# Patient Record
Sex: Male | Born: 1950
Health system: Southern US, Community
[De-identification: ages and names within clinical notes are randomized; demographics above are authoritative.]

## PROBLEM LIST (undated history)

## (undated) DIAGNOSIS — H269 Unspecified cataract: Secondary | ICD-10-CM

## (undated) DIAGNOSIS — M1990 Unspecified osteoarthritis, unspecified site: Secondary | ICD-10-CM

## (undated) DIAGNOSIS — G2581 Restless legs syndrome: Secondary | ICD-10-CM

## (undated) HISTORY — PX: CHOLECYSTECTOMY: SHX55

## (undated) HISTORY — PX: OTHER SURGICAL HISTORY: SHX169

## (undated) HISTORY — DX: Unspecified cataract: H26.9

---

## 2008-02-09 ENCOUNTER — Ambulatory Visit (HOSPITAL_BASED_OUTPATIENT_CLINIC_OR_DEPARTMENT_OTHER): Admission: RE | Admit: 2008-02-09 | Discharge: 2008-02-09 | Payer: Self-pay | Admitting: Orthopedic Surgery

## 2008-03-26 ENCOUNTER — Ambulatory Visit (HOSPITAL_COMMUNITY): Admission: EM | Admit: 2008-03-26 | Discharge: 2008-03-27 | Payer: Self-pay | Admitting: Emergency Medicine

## 2008-03-26 ENCOUNTER — Encounter (INDEPENDENT_AMBULATORY_CARE_PROVIDER_SITE_OTHER): Payer: Self-pay | Admitting: General Surgery

## 2008-12-25 ENCOUNTER — Ambulatory Visit: Payer: Self-pay | Admitting: Gastroenterology

## 2009-01-03 ENCOUNTER — Ambulatory Visit: Payer: Self-pay | Admitting: Gastroenterology

## 2010-08-10 IMAGING — RF DG CHOLANGIOGRAM OPERATIVE
1 series · 4 of 4 positions shown · non-contrast
Comparison: None

CLINICAL DATA: Cholecystitis

INTRAOPERATIVE CHOLANGIOGRAM
TECHNIQUE: Multiple fluoroscopic spot radiographs were obtained
during intraoperative cholangiogram and are submitted for
interpretation post-operatively.

[Series 1: run · 4 of 96 frames shown]
[frame 15/96]
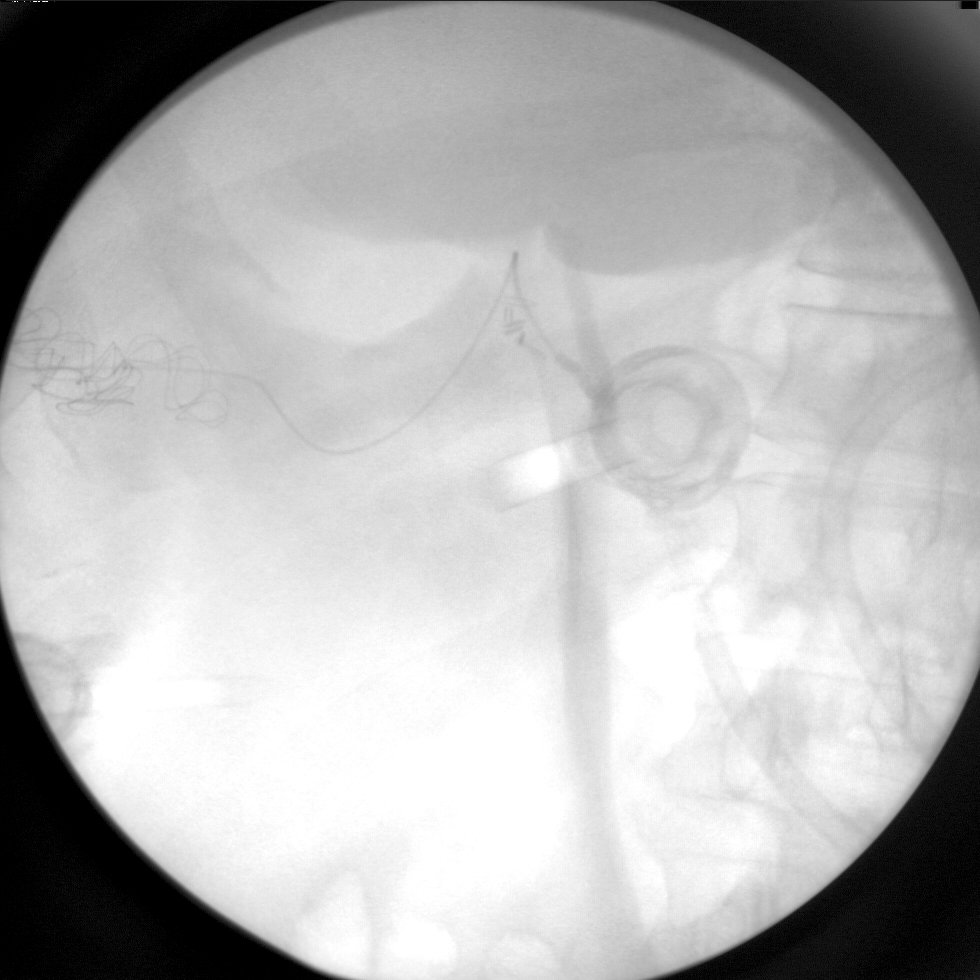
[frame 49/96]
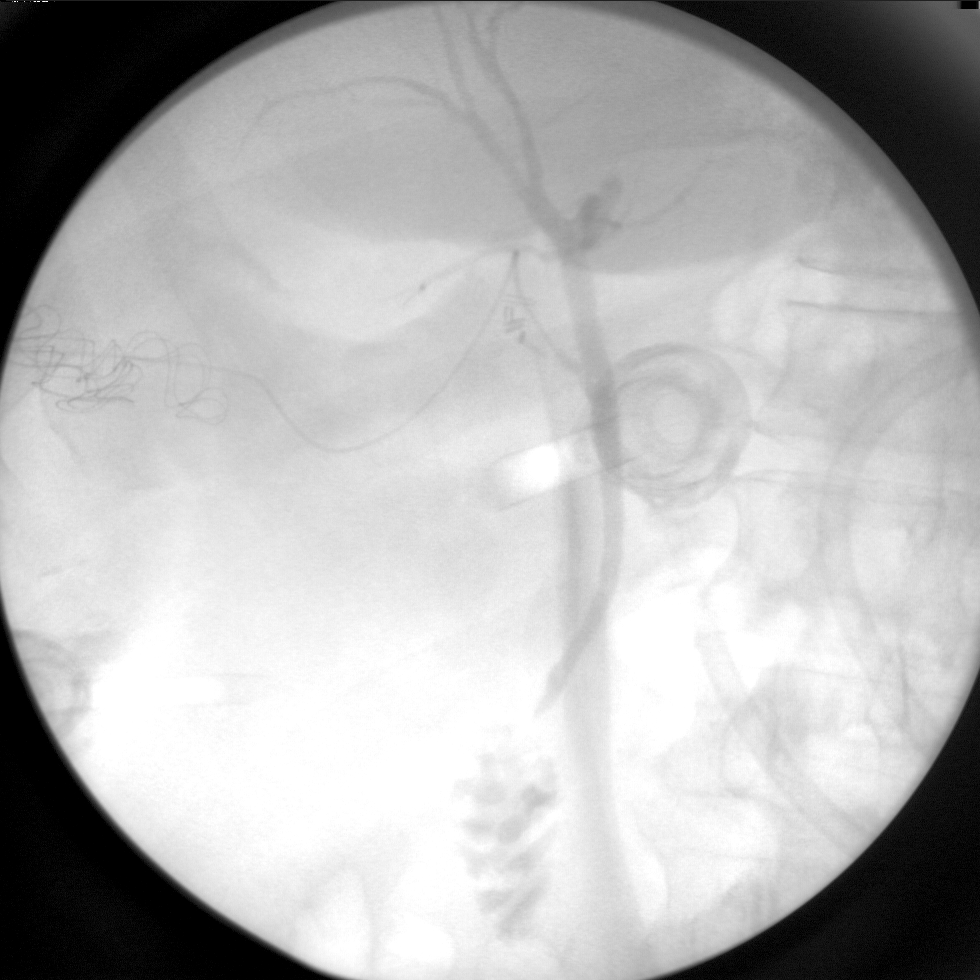
[frame 82/96]
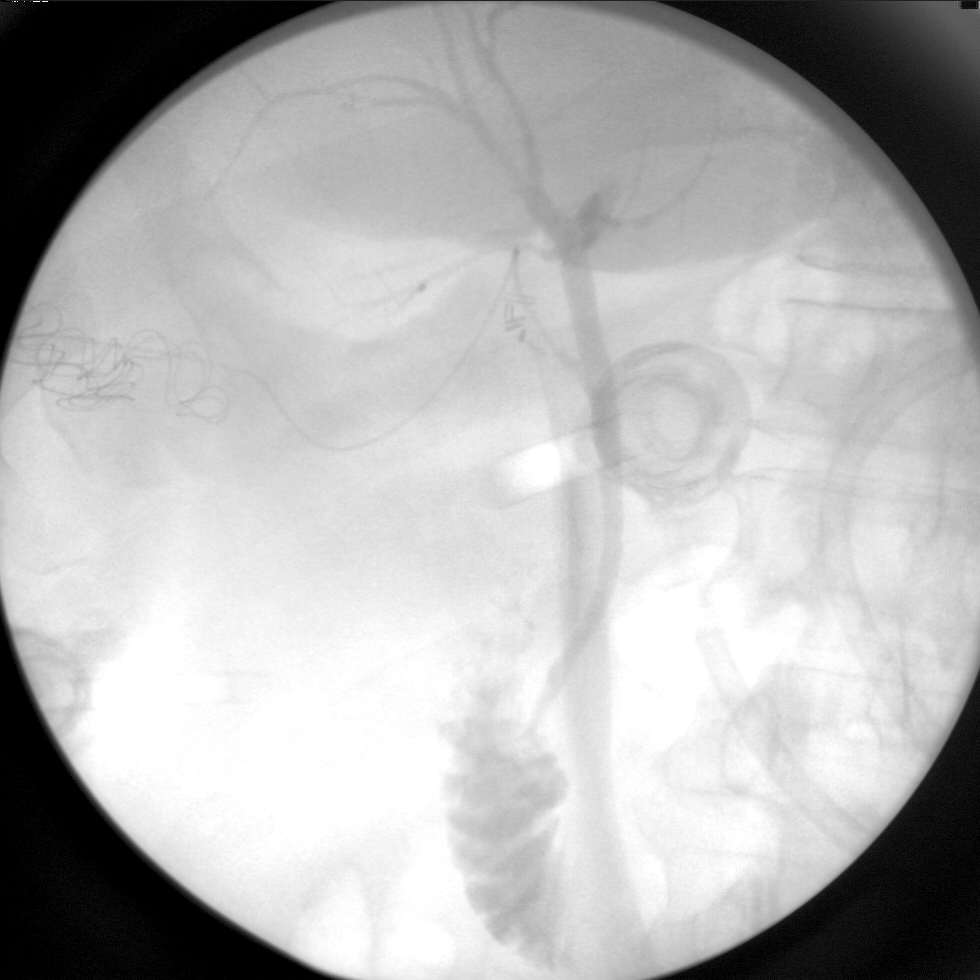
[frame 93/96]
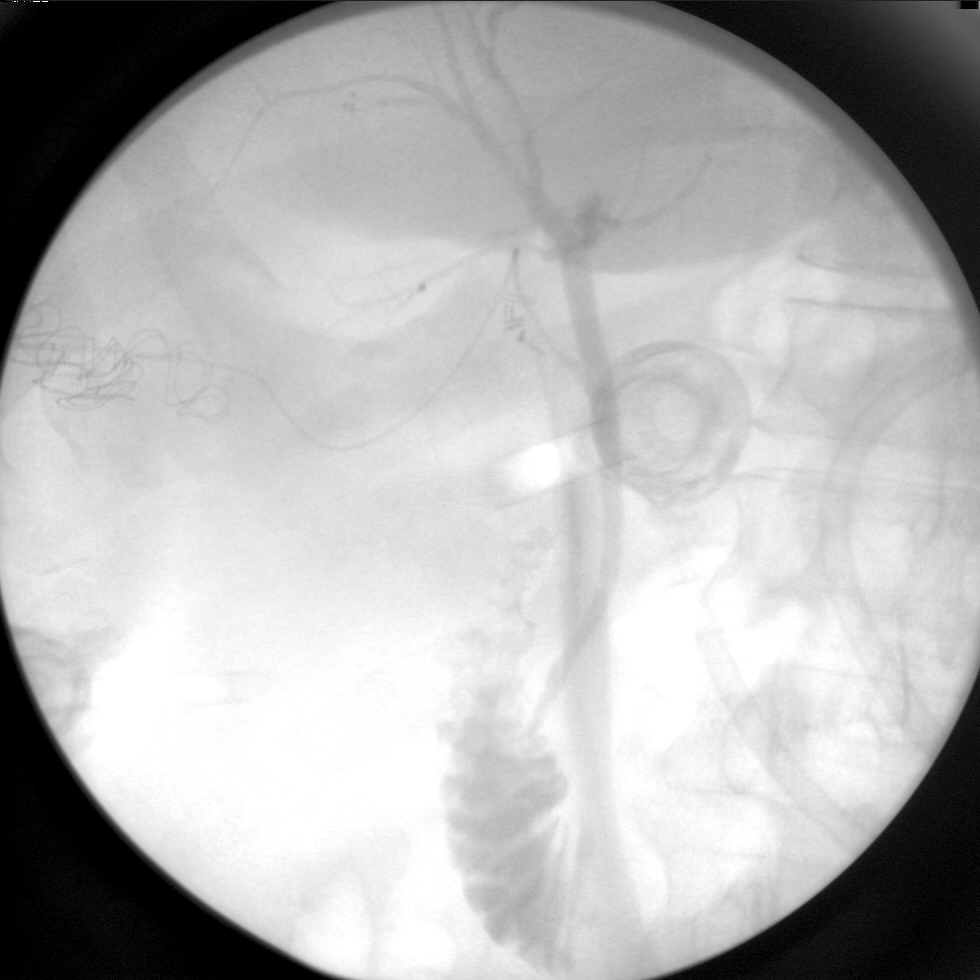

[4 of 4 positions shown; findings below may reference images not displayed]

FINDINGS: A single run of 96 images is submitted.  These
demonstrate opacification of normal caliber intrahepatic ductal
system.  A subtle tiny area of incomplete opacification just below
the cystic duct remnant (image 65 of series one) most likely
represents an air bubble.  No contrast extravasation to suggest
biliary leak.  Free contrast spill into the duodenum.
IMPRESSION: 1.  No evidence of biliary leak.
2.  Subtle area of incomplete opacification just below the cystic
duct insertion.  Most likely an air bubble.  A tiny common duct
stone cannot be excluded.

## 2010-10-06 NOTE — Op Note (Signed)
NAME:  NORMA, IGNASIAK                 ACCOUNT NO.:  [id number]   MEDICAL RECORD NO.:  [id number]          PATIENT TYPE:  OIB   LOCATION:  1511                         FACILITY:  [facility]   PHYSICIAN:  Angelia Mould. Derrell Lolling, M.D.DATE OF BIRTH:  06/28/1950   DATE OF PROCEDURE:  03/26/2008  DATE OF DISCHARGE:                               OPERATIVE REPORT   PREOPERATIVE DIAGNOSIS:  Acute cholecystitis with cholelithiasis.   POSTOPERATIVE DIAGNOSIS:  Acute gangrenous cholecystitis with  cholelithiasis   OPERATION PERFORMED:  Laparoscopic cholecystectomy with intraoperative  cholangiogram.   FIRST ASSISTANT:  Dr. Darnell Level.   OPERATIVE INDICATIONS:  This is a 60 year old white man who presents  with a 48 to 56-hour history of upper abdominal pain, right upper  quadrant pain, nausea and vomiting.  He was not having any fever or  chills.  He was evaluated by Dr. Catha Gosselin and lab work was done  revealing a white blood cell count of 15,500 and a total bilirubin of  1.8 but otherwise all of his lab work was normal.  Ultrasound was done  at [facility] which by report shows multiple tiny gallstones,  normal gallbladder wall thickness, no fluid or abscess.  He was seen in  the emergency room in the evening and advised to undergo surgery this  evening.   OPERATIVE FINDINGS:  The gallbladder was acutely inflamed, thick-walled,  was greenish black and was necrotic in nature and with suspicion of  developing gangrene.  The intraoperative cholangiogram was normal,  showing normal intrahepatic and extrahepatic bile ducts, no filling  defects, and no obstruction with good flow of contrast into the  duodenum.  Otherwise the liver, stomach, duodenum, small intestine and  large intestine were grossly normal to inspection although his omentum  was quite large and obscured most of the visualization of the lower GI  tract.   OPERATIVE TECHNIQUE:  Following induction of general endotracheal  anesthesia, the patient's abdomen was prepped and draped in a sterile  fashion.  Intravenous antibiotics had previously been given.  The  patient was identified as correct patient and correct procedure.  A time-  out was held.  Then 0.5% Marcaine with epinephrine was used as local  infiltration anesthetic.   A vertically oriented incision was made at the superior rim of the  umbilicus.  The fascia was incised in the midline and the abdominal  cavity entered under direct vision.  A 10-mm  Hassan trocar was inserted  and secured with a pursestring suture of 0 Vicryl.  Pneumoperitoneum was  created.  Video cam was inserted with visualization and findings as  described above.  We placed an 11-mm trocar in the subxiphoid region and  two 5-mm  trocars in the right upper quadrant.  We could visualize the  fundus of the gallbladder and recognized its acute gangrenous  appearance.  We used a suction trocar to puncture the fundus of the  gallbladder and evacuate most of the bile.  This collapsed the  gallbladder somewhat so we could grab it.  We placed an angled video  camera in place and  then slowly teased the omental adhesions off of the  gallbladder.  We used suction and aqua dissection to do this gently.  We  ultimately identified the infundibulum of the gallbladder.  We slowly  dissected out the cystic duct and the cystic artery.  The cystic artery  was identified as it went onto the wall of the gallbladder, secured with  multiple metal clips and divided.  We then isolated the cystic duct and  created a nice window behind this.   A cholangiogram catheter was inserted in the cystic duct.  A  cholangiogram was obtained using the C-arm.  The cholangiogram was  normal as described above.  The catheter was removed, the cystic duct  secured with three or four metal clips and divided.  The gallbladder was  then dissected from its bed with blunt dissection and electrocautery and  placed in the  specimen bag and removed.  We made a couple of small holes  in the gallbladder.  We spilled a few tiny stones which were evacuated  using the 10-mm aspirator.  After the gallbladder was completely  removed, we spent a good deal of time irrigating the subphrenic and  subphrenic spaces with about 3 liters of saline until we were satisfied  that there was no bleeding and no bile leak anywhere and irrigation  fluid was completely clear and we saw no more stones.  We inspected the  bed of the gallbladder.  It looked dry.  We did not think that a drain  was necessary.   The trocars were removed under direct vision.  There was no bleeding  from the trocar sites.  Pneumoperitoneum was released.  The fascia at  the umbilicus was closed with 0 Vicryl sutures.  The skin incisions were  all closed with subcuticular sutures of 4-0 Monocryl and Steri-Strips.  Clean bandages were placed and the patient taken to the recovery room in  stable condition.   ESTIMATED BLOOD LOSS:  Estimated blood loss was about 40 mL or less.   COMPLICATIONS:  None.   COUNTS:  Sponge, needle and instrument counts were correct.      Angelia Mould. Derrell Lolling, M.D.  Electronically Signed     HMI/MEDQ  D:  03/26/2008  T:  03/27/2008  Job:  [id number]   cc:   Caryn Bee L. Little, M.D.  Fax: [phone]

## 2010-10-06 NOTE — H&P (Signed)
NAME:  [name] [name], [name] [name]                 ACCOUNT NO.:  [id number]   MEDICAL RECORD NO.:  [id number]          PATIENT TYPE:  INP   LOCATION:  1511                         FACILITY:  [facility]   PHYSICIAN:  Angelia Mould. Derrell Lolling, M.D.DATE OF BIRTH:  [birth year]   DATE OF ADMISSION:  03/26/2008  DATE OF DISCHARGE:                              HISTORY & PHYSICAL   CHIEF COMPLAINT:  Right upper quadrant abdominal pain.   HISTORY OF PRESENT ILLNESS:  This is a generally healthy 60 year old  gentleman referred by Dr. Caryn Bee little for management of acute  cholecystitis.   The patient has not had any prior gallbladder problems or symptoms of  biliary colic.  48 hours ago he noted the mostly sudden onset of diffuse  abdominal pain.  Early in the course, he was nauseated and vomited 3  times.  He has not vomited today.  Has not had any diarrhea, fever or  chills.  The pain is now localized to the right upper quadrant.  No  prior episodes.   He saw Dr. Catha Gosselin.  Lab work was done in his office.  We have the  reports on the chart.  Hemoglobin 15, white blood cell count 15,500,  total bilirubin 1.8.  The rest of his liver function tests are normal.  BUN 18, creatinine 1.1, glucose 98, amylase 36, lipase 27.  Ultrasound  was done at [facility].  I have discussed this with Dr.  Stephani Police.  The ultrasound shows tiny gallstones and sludge but no  wall thickening and no pericholecystic fluid, and the common bile duct  is 5 mm in diameter.  He has fatty infiltration of the liver.   The patient is admitted for further evaluation and management.   PAST HISTORY:  Torn retina in 1995.  Cataract surgery.  Left knee  arthroscopy by Dr. Sedonia Small in 2009.  Injury right leg in [place] 1971.  No medical problems.   CURRENT MEDICATIONS:  None.   DRUG ALLERGIES:  Possibly PENICILLIN as a child where he had a swelling  of his arm.   SOCIAL HISTORY:  The patient married and has 2 children.  He is a  IT trainer.  Denies alcohol or tobacco.   FAMILY HISTORY:  Mother living age 64.  Has had a cholecystectomy and  has had shingles.  Father deceased age 69.  Had Parkinson's disease,  fell and had a hip fracture, developed a seizure, and had pneumonia and  expired as a result of that illness.   REVIEW OF SYSTEMS:  A 15-system review of systems is performed and is  noncontributory except as described above.   PHYSICAL EXAMINATION:  CONSTITUTIONAL:  A pleasant gentleman in minimal  distress.  Alert and oriented, friendly and cooperative.  VITAL SIGNS:  Temperature 98.1, blood pressure 115/79, pulse initially  was 121 and now is about 95, respirations 20, oxygen saturation 99% on  room air.  EYES:  Sclerae clear.  Extraocular movements intact.  EARS, NOSE, MOUTH AND THROAT:  Without gross lesions.  NECK:  Supple, nontender.  No masses.  No jugular venous distention.  LUNGS:  Clear to auscultation.  No chest wall tenderness.  HEART:  Regular rate and rhythm.  No murmur.  Radial and dorsalis pedis  pulses are palpable.  ABDOMEN:  Large, soft.  Tender in the right upper quadrant but with  minimal guarding.  No rebound.  No percussion tenderness.  No mass and  there is no distention.  Liver and spleen are not palpable.  I do not  see any scars or hernias.  GENITOURINARY:  Normal penis, scrotum, testes.  No inguinal mass or  hernia.  EXTREMITIES:  Moves all 4 extremities well without pain or deformity.  Does have some scars from previous surgery.  NEUROLOGIC:  No gross motor sensory deficits.   ASSESSMENT:  Acute cholecystitis with cholelithiasis.   PLAN:  1. The patient will be admitted, started on IV antibiotics and taken      to the operating room within the next 12 hours to 24 hours for      cholecystectomy.  2. I have discussed the indication and details of surgery with him and      his wife.  Risks and complications have been outlined, including      but not limited to bleeding,  infection, conversion to open      laparotomy, injury to adjacent organs such as the main bile duct or      intestine with major reconstructive surgery, bile leak, wound      infection, cardiac, pulmonary and thromboembolic problems.  He      seems to understand these issues well.  At this time, all of his      questions are answered.  He is in complete agreement with this      plan.      Angelia Mould. Derrell Lolling, M.D.  Electronically Signed     HMI/MEDQ  D:  03/26/2008  T:  03/27/2008  Job:  [id number]   cc:   Caryn Bee L. Little, M.D.  Fax: [phone]

## 2010-10-06 NOTE — Op Note (Signed)
NAME:  [name] [name], [name] [name]                 ACCOUNT NO.:  [id number]   MEDICAL RECORD NO.:  [id number]          PATIENT TYPE:  AMB   LOCATION:  [facility]                         FACILITY:  [facility]   PHYSICIAN:  Marlowe Kays, M.D.  DATE OF BIRTH:  April 27, 1951   DATE OF PROCEDURE:  02/09/2008  DATE OF DISCHARGE:                               OPERATIVE REPORT   PREOPERATIVE DIAGNOSES:  1. Torn medial meniscus.  2. Osteoarthritis, left knee.   POSTOPERATIVE DIAGNOSES:  1. Torn medial and lateral menisci.  2. Osteoarthritis left knee.   OPERATION:  Left knee arthroscopy.  1. Partial medial and lateral meniscectomies.  2. Debridement of medial and lateral femoral condyles.  3. Removal of osteophyte from lateral joint adjacent to the anterior      cruciate ligament.   SURGEON:  Marlowe Kays, M.D.   ASSISTANT:  Nurse.   ANESTHESIA:  General.   PATHOLOGY AND JUSTIFICATION FOR PROCEDURE:  Because of pain and swelling  of the left knee, he had an MRI performed which demonstrated the  preoperative diagnoses.  He and his wife were advised prior surgery that  the arthroscopy was not designed to correct the osteoarthritis and but  to take care of any problems generated by the torn medial meniscus and  that further measures might be needed later depending on the success of  today's procedure.   PROCEDURE:  Satisfied general anesthesia, Ace wrap and knee support to  right lower extremity.  Pneumatic tourniquet applied to left lower  extremity with the leg Esmarched out non sterilely.  Tourniquet inflated  and thigh stabilizer applied.  Left leg was then prepped with DuraPrep  from stabilizer to ankle, draped in sterile field.  Superior and medial  saline inflow.  First through an anterolateral portal the medial  compartment of the knee joint was evaluated.  He had a good bit of  synovitis in the anterior medial joint which I resected with a 3.5  shaver.  The meniscus was intact.  There was  significant tearing,  however, from the junction of the mid and posterior third all the way  into the under condylar area.  This was associated almost full thickness  wear of a good bit of the medial femoral condyle and some wear of the  medial tibial plateau.  I shaved down the medial femoral condyle with a  3.5 shaver as well as partially debrided up the torn meniscus which I  then debrided back to stable rim with combination of baskets and finally  shaved it once more with the 3.5 shaver.  The residual rim appeared to  be nice and stable and smooth.  I then reversed portals.  He had the  osteophyte noted above which I resected with a 3.5 shaver as well as a  good bit of synovitis laterally which I resected for better  visualization.  His lateral femoral condyle had grade 3/4 wear as well  and I debrided this down with the shaver.  Posteriorly, the torn lateral  meniscus at its mid posterior third was shaved back to a smooth rim with  the shaver.  Final pictures were taken.  I then looked up the lateral  gutter and suprapatellar area.  The patella was worn but there was  nothing arthroscopically treatable.  I then irrigated the knee joint  until clear and all fluid possible was removed.  The two anterior  portals were closed with 4-0 nylon and I injected 20 mL of 0.5% Marcaine  with adrenaline and 4 mg of morphine through the inflow apparatus which  was removed and this portal closed with 4-0 nylon as well.  Betadine,  Adaptic and dry sterile dressing were applied.  Tourniquet was released.  He tolerated the procedure well and was taken to recovery in  satisfactory condition with no known complications.           ______________________________  Marlowe Kays, M.D.     JA/MEDQ  D:  02/09/2008  T:  02/12/2008  Job:  [id number]

## 2011-02-22 LAB — POCT HEMOGLOBIN-HEMACUE: Hemoglobin: 14.4

## 2011-02-23 LAB — COMPREHENSIVE METABOLIC PANEL
ALT: 36
Albumin: 3.3 — ABNORMAL LOW
BUN: 20
CO2: 26
Calcium: 9
Chloride: 106
GFR calc Af Amer: >60
GFR calc non Af Amer: 59 — ABNORMAL LOW
Glucose, Bld: 107 — ABNORMAL HIGH
Potassium: 4
Total Protein: 6.3

## 2011-02-23 LAB — URINALYSIS, ROUTINE W REFLEX MICROSCOPIC
Leukocytes, UA: NEGATIVE
Nitrite: NEGATIVE
Protein, ur: 30 — AB
Specific Gravity, Urine: 1.03
Specific Gravity, Urine: 1.036 — ABNORMAL HIGH
Urobilinogen, UA: 1
Urobilinogen, UA: 1

## 2011-02-23 LAB — URINE MICROSCOPIC-ADD ON

## 2011-02-23 LAB — CBC
HCT: 41.8
Hemoglobin: 14.1
Platelets: 220

## 2013-01-05 ENCOUNTER — Other Ambulatory Visit: Payer: Self-pay | Admitting: Orthopedic Surgery

## 2013-01-16 ENCOUNTER — Encounter (HOSPITAL_COMMUNITY): Payer: Self-pay | Admitting: Pharmacy Technician

## 2013-01-19 NOTE — Patient Instructions (Signed)
[name] [name]  01/19/2013   Your procedure is scheduled on:  01/31/13              Surgery 100pm-400pm  Report to [facility] at  1030  AM.  Call this number if you have problems the morning of surgery: [phone]   Remember:   Do not eat food or drink liquids after midnight.   Take these medicines the morning of surgery with A SIP OF WATER:    Do not wear jewelry,  Do not wear lotions, powders, or perfumes   Men may shave face and neck.  Do not bring valuables to the hospital.  Contacts, dentures or bridgework may not be worn into surgery.  Leave suitcase in the car. After surgery it may be brought to your room.  For patients admitted to the hospital, checkout time is 11:00 AM the day of  discharge.       SEE CHG INSTRUCTION SHEET    Please read over the following fact sheets that you were given: MRSA Information, coughing and deep breathing exercises, leg exercises, Incentive Spirometry Fact Sheet, Blood Transfusion Fact sheet                Failure to comply with these instructions may result in cancellation of your surgery.                Patient Signature ____________________________              Nurse Signature _____________________________

## 2013-01-23 ENCOUNTER — Encounter (HOSPITAL_COMMUNITY)
Admission: RE | Admit: 2013-01-23 | Discharge: 2013-01-23 | Disposition: A | Discharge status: Home / Self Care | Payer: BC Managed Care – PPO | Source: Ambulatory Visit | Attending: Orthopedic Surgery | Admitting: Orthopedic Surgery

## 2013-01-23 ENCOUNTER — Encounter (HOSPITAL_COMMUNITY): Payer: Self-pay

## 2013-01-23 DIAGNOSIS — Z01812 Encounter for preprocedural laboratory examination: Secondary | ICD-10-CM | POA: Insufficient documentation

## 2013-01-23 HISTORY — DX: Restless legs syndrome: G25.81

## 2013-01-23 HISTORY — DX: Unspecified osteoarthritis, unspecified site: M19.90

## 2013-01-23 LAB — URINALYSIS, ROUTINE W REFLEX MICROSCOPIC
Glucose, UA: NEGATIVE mg/dL
Leukocytes, UA: NEGATIVE
Specific Gravity, Urine: 1.026 (ref 1.005–1.030)
pH: 6.5 (ref 5.0–8.0)

## 2013-01-23 LAB — COMPREHENSIVE METABOLIC PANEL
ALT: 17 U/L (ref 0–53)
AST: 17 U/L (ref 0–37)
Albumin: 3.4 g/dL — ABNORMAL LOW (ref 3.5–5.2)
Alkaline Phosphatase: 72 U/L (ref 39–117)
Glucose, Bld: 103 mg/dL — ABNORMAL HIGH (ref 70–99)
Potassium: 4 mEq/L (ref 3.5–5.1)
Sodium: 140 mEq/L (ref 135–145)
Total Protein: 6.6 g/dL (ref 6.0–8.3)

## 2013-01-23 LAB — SURGICAL PCR SCREEN
MRSA, PCR: NEGATIVE
Staphylococcus aureus: NEGATIVE

## 2013-01-23 LAB — URINE MICROSCOPIC-ADD ON

## 2013-01-23 LAB — APTT: aPTT: 30 seconds (ref 24–37)

## 2013-01-23 NOTE — Progress Notes (Signed)
Requested CXR done 01/11/13 from [facility] [place] by fax.

## 2013-01-23 NOTE — Progress Notes (Signed)
Surgical clearance note 01/11/13 on chart .  EKG 01/11/13 on chart CBC 01/18/13 chart

## 2013-01-30 ENCOUNTER — Other Ambulatory Visit: Payer: Self-pay | Admitting: Orthopedic Surgery

## 2013-01-30 NOTE — H&P (Signed)
[name] [name]  DOB: 07/24/1950 Married / Language: English / Race: White Male  Date of Admission:  01/31/2013   Chief Complaint:  Bilateral Knee Pain  History of Present Illness(Alexzandrew L Perkins, III PA-C; 01/04/2013 3:06 PM) The patient is a 61 year old male who comes in for a preoperative History and Physical. The patient is scheduled for a bilateral total knee arthroplasty to be performed by Dr. Frank V. [name], MD at [facility] on 01/31/2013. The patient is a 61 year old male who presents for follow up of their knee. 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 knee. Symptoms reported today include: pain, pain after sitting, swelling, aching and throbbing. The patient feels that they are doing poorly and report their pain level to be 3 / 10. Current treatment includes: activity modification and NSAIDs. The following medication has been used for pain control: antiinflammatory medication. [facility]id[facility]. He was a previous patient of Dr. James Aplington and is coming in transferring his care over to Dr. [name]. He describes pain in both knees. The right knee seems to be the more problematic of the two. He had an injury back in [place] about 43 years ago, when he was jumping off the back of a truck with his full rucksack and was loaded down with ammo. and weapons. He had pain in the knee at that time and [place]. He underwent an arthrotomy with staple repair of the torn medial collateral ligament, also excision of a bucket-handle tear. After he finished college, he eventually was seen by Dr. James Aplington and told that he was developing arthritis in that knee and would need surgery at a later point. Over the years, the arthritis in that knee has progressively gotten worse. The left knee has also been seen and evaluated by Dr.  Aplington. He had a knee scope back in 2009 for this, and it's been hurting a little bit more which he attributes to compensation because of the right knee. Both knees have steadily gotten worse over many years, but over the past 5 years, both knees have been much more progressive. He has intermittent episodes of flare ups. His last horrible flare up was July of last year. Prior to that he had been doing exercises. He was a member of the [employer] and was going for swimming and weight lifting. Following that bad flare up in July of 2013, he, unfortunately, stopped doing his exercises because of pain. Since that time he's had increasing pain with walking. Going up and down stairs is the worst. He has also had some trouble getting in and out of his car when he had a sedan, but now he has an SUV and it is a little bit easier sitting up higher. Both knees have popping and some buckling, but he denies any locking. He has noticed some swelling in them. He's never had injections in either knee. He is a friend of Tom Andraza, who is a patient of Dr. [name], and has recommended him to come over to see Dr. [name] about possible need for surgery. 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 It's very difficult when he gets up and the first few steps are difficult. He also has been told he had heel spurs in the   past which have interfered with his walking, and it's been much more difficult over the past several years to exercise, and he had to stop going to the [employer] a year ago and it's been very difficult to try to do any weight loss. He states that he feels like if he lost some of the weight his knees would feel better but it's very difficult at this time. When asked, he said he's never had injections in the knees before.  His knees continue to be a problem and he is ready to get the knees fixed. Risks and benefits of the surgery have been discussed with the  patient and they elect to proceed with surgery.  There are on active contraindications to upcoming procedure such as ongoing infection or progressive neurological disease.   Problem List Primary osteoarthritis of both knees   Allergies Penicillins. Swelling.   Family History Cerebrovascular Accident. father Diabetes Mellitus. grandmother fathers side   Social History Living situation. live with spouse Marital status. married Number of flights of stairs before winded. greater than 5 Drug/Alcohol Rehab (Previously). no Exercise. Exercises weekly; does gym / weights Illicit drug use. no Pain Contract. no Tobacco / smoke exposure. no Tobacco use. never smoker Children. 2 Current work status. working full time Drug/Alcohol Rehab (Currently). no Alcohol use. current drinker; drinks wine; only occasionally per week   Past Surgical History Arthroscopy of Knee. left Cataract Surgery. bilateral Gallbladder Surgery. laporoscopic   Medical History Gastroesophageal Reflux Disease   Review of Systems General:Not Present- Chills, Fever, Night Sweats, Fatigue, Weight Gain, Weight Loss and Memory Loss. Skin:Not Present- Hives, Itching, Rash, Eczema and Lesions. HEENT:Not Present- Tinnitus, Headache, Double Vision, Visual Loss, Hearing Loss and Dentures. Respiratory:Not Present- Shortness of breath with exertion, Shortness of breath at rest, Allergies, Coughing up blood and Chronic Cough. Cardiovascular:Not Present- Chest Pain, Racing/skipping heartbeats, Difficulty Breathing Lying Down, Murmur, Swelling and Palpitations. Gastrointestinal:Not Present- Bloody Stool, Heartburn, Abdominal Pain, Vomiting, Nausea, Constipation, Diarrhea, Difficulty Swallowing, Jaundice and Loss of appetitie. Male Genitourinary:Present- Frequency and Urinating at Night. Not Present- Urinary frequency, Blood in Urine, Weak urinary stream, Discharge, Flank Pain, Incontinence,  Painful Urination, Urgency and Urinary Retention. Musculoskeletal:Present- Muscle Pain, Joint Swelling and Joint Pain. Not Present- Muscle Weakness, Back Pain, Morning Stiffness and Spasms. Neurological:Not Present- Tremor, Dizziness, Blackout spells, Paralysis, Difficulty with balance and Weakness. Psychiatric:Not Present- Insomnia.   Vitals Weight: 280 lb Height: 71 in Body Surface Area: 2.52 m Body Mass Index: 39.05 kg/m Pulse: 64 (Regular) Resp.: 16 (Unlabored) BP: 140/82 (Sitting, Left Arm, Standard)    Physical Exam The physical exam findings are as follows:   General Mental Status - Alert, cooperative and good historian. General Appearance- pleasant. Not in acute distress. Orientation- Oriented X3. Build & Nutrition- Well nourished and Well developed.   Head and Neck Head- normocephalic, atraumatic . Neck Global Assessment- supple. no bruit auscultated on the right and no bruit auscultated on the left.   Eye Pupil- Bilateral- Regular and Round. Motion- Bilateral- EOMI.   Chest and Lung Exam Auscultation: Breath sounds:- clear at anterior chest wall and - clear at posterior chest wall. Adventitious sounds:- No Adventitious sounds.   Cardiovascular Auscultation:Rhythm- Regular rate and rhythm. Heart Sounds- S1 WNL and S2 WNL. Murmurs & Other Heart Sounds:Auscultation of the heart reveals - No Murmurs.   Abdomen Inspection:Contour- Generalized moderate distention. Palpation/Percussion:Tenderness- Abdomen is non-tender to palpation. Rigidity (guarding)- Abdomen is soft. Auscultation:Auscultation of the abdomen reveals - Bowel sounds normal.   Male Genitourinary Not done,   not pertinent to present illness  Musculoskeletal On exam, right knee is examined. He has full extension with flexion back to 110 actively, 115 passively. He has severe marked crepitus on PROM. I do not appreciate any effusion. The knee is  stable with varus and valgus stressing. He does have approximately a 3-4 inch incision over the medial knee, over the medial side of the right knee where previous arthrotomy was done. He is tender more medial than lateral on palpation. Left knee ROM is 0-120. Moderate crepitus noted. No effusion. Knee is stable with varus and valgus stressing. Tender more medial than lateral on palpation.  RADIOGRAPHS: We have an AP of the standing bilateral knees and a lateral view of the right knee. Standing AP view of the right knee shows the knee has gone into varus mal-alignment with near complete loss of the joint space medially but already bone on bone throughout most of the joint line, moderate spurring noted medially and laterally on the femur and significant lateral spurring on the tibia. He does have a staple in the upper portion of the medial epicondyle.  AP view of the left knee shows near bone on bone medial compartment. A lateral view of the right knee confirms bone on bone throughout the knee on the weight bearing surface. Posterior tibial and femoral spurs are noted and significantly patellofemoral compartment with noted spurring.  Assessment & Plan Primary osteoarthritis of both knees (715.16)  Note: Plan is for a Bilateral Total Knee Replacements by Dr. [name].  Plan is to go home.  The patient does not have any contraindications and will recieve TXA (tranexamic acid) prior to surgery.  Time spent ~ 20 minutes  Signed electronically by Alexzandrew L Perkins, III PA-C  

## 2013-01-31 ENCOUNTER — Inpatient Hospital Stay (HOSPITAL_COMMUNITY): Payer: BC Managed Care – PPO | Admitting: Certified Registered"

## 2013-01-31 ENCOUNTER — Encounter (HOSPITAL_COMMUNITY): Payer: Self-pay | Admitting: *Deleted

## 2013-01-31 ENCOUNTER — Encounter (HOSPITAL_COMMUNITY)
Admission: RE | Disposition: A | Discharge status: Rehabilitation Facility | Payer: Self-pay | Source: Ambulatory Visit | Attending: Orthopedic Surgery

## 2013-01-31 ENCOUNTER — Encounter (HOSPITAL_COMMUNITY): Payer: Self-pay | Admitting: Certified Registered"

## 2013-01-31 ENCOUNTER — Inpatient Hospital Stay (HOSPITAL_COMMUNITY)
Admission: RE | Admit: 2013-01-31 | Discharge: 2013-02-05 | DRG: 471 | Disposition: A | Discharge status: Rehabilitation Facility | Payer: BC Managed Care – PPO | Source: Ambulatory Visit | Attending: Orthopedic Surgery | Admitting: Orthopedic Surgery

## 2013-01-31 DIAGNOSIS — K219 Gastro-esophageal reflux disease without esophagitis: Secondary | ICD-10-CM | POA: Diagnosis present

## 2013-01-31 DIAGNOSIS — I9589 Other hypotension: Secondary | ICD-10-CM | POA: Diagnosis not present

## 2013-01-31 DIAGNOSIS — I959 Hypotension, unspecified: Secondary | ICD-10-CM

## 2013-01-31 DIAGNOSIS — M179 Osteoarthritis of knee, unspecified: Secondary | ICD-10-CM

## 2013-01-31 DIAGNOSIS — Z96653 Presence of artificial knee joint, bilateral: Secondary | ICD-10-CM

## 2013-01-31 DIAGNOSIS — G2581 Restless legs syndrome: Secondary | ICD-10-CM | POA: Diagnosis present

## 2013-01-31 DIAGNOSIS — D62 Acute posthemorrhagic anemia: Secondary | ICD-10-CM

## 2013-01-31 DIAGNOSIS — E871 Hypo-osmolality and hyponatremia: Secondary | ICD-10-CM

## 2013-01-31 DIAGNOSIS — Z880 Allergy status to penicillin: Secondary | ICD-10-CM

## 2013-01-31 DIAGNOSIS — M1710 Unilateral primary osteoarthritis, unspecified knee: Secondary | ICD-10-CM

## 2013-01-31 DIAGNOSIS — Z6841 Body Mass Index (BMI) 40.0 and over, adult: Secondary | ICD-10-CM

## 2013-01-31 HISTORY — PX: TOTAL KNEE ARTHROPLASTY: SHX125

## 2013-01-31 LAB — ABO/RH: ABO/RH(D): B POS

## 2013-01-31 SURGERY — ARTHROPLASTY, KNEE, BILATERAL, TOTAL
Anesthesia: Epidural | Site: Knee | Laterality: Bilateral | Wound class: Clean

## 2013-01-31 MED ORDER — ONDANSETRON HCL 4 MG PO TABS: 4.0000 mg | ORAL_TABLET | Freq: Four times a day (QID) | ORAL | Status: DC | PRN

## 2013-01-31 MED ORDER — FLEET ENEMA 7-19 GM/118ML RE ENEM: 1.0000 | ENEMA | Freq: Once | RECTAL | Status: AC | PRN

## 2013-01-31 MED ORDER — DIPHENHYDRAMINE HCL 25 MG PO CAPS
25.0000 mg | ORAL_CAPSULE | ORAL | Status: DC | PRN
  Administered 2013-02-02: 25 mg via ORAL
  Filled 2013-01-31: qty 1

## 2013-01-31 MED ORDER — CHLORHEXIDINE GLUCONATE 4 % EX LIQD
60.0000 mL | Freq: Once | CUTANEOUS | Status: DC
  Filled 2013-01-31: qty 60

## 2013-01-31 MED ORDER — TRANEXAMIC ACID 100 MG/ML IV SOLN
1000.0000 mg | INTRAVENOUS | Status: AC
  Administered 2013-01-31: 1000 mg via INTRAVENOUS
  Filled 2013-01-31: qty 10

## 2013-01-31 MED ORDER — ACETAMINOPHEN 10 MG/ML IV SOLN
1000.0000 mg | Freq: Once | INTRAVENOUS | Status: AC
  Administered 2013-01-31: 1000 mg via INTRAVENOUS
  Filled 2013-01-31: qty 100

## 2013-01-31 MED ORDER — VANCOMYCIN HCL 10 G IV SOLR
1500.0000 mg | INTRAVENOUS | Status: AC
  Administered 2013-01-31: 1500 mg via INTRAVENOUS
  Filled 2013-01-31: qty 1500

## 2013-01-31 MED ORDER — MENTHOL 3 MG MT LOZG: 1.0000 | LOZENGE | OROMUCOSAL | Status: DC | PRN

## 2013-01-31 MED ORDER — ONDANSETRON HCL 4 MG/2ML IJ SOLN
INTRAMUSCULAR | Status: DC | PRN
  Administered 2013-01-31: 4 mg via INTRAVENOUS

## 2013-01-31 MED ORDER — PROPOFOL INFUSION 10 MG/ML OPTIME
INTRAVENOUS | Status: DC | PRN
  Administered 2013-01-31: 120 ug/kg/min via INTRAVENOUS

## 2013-01-31 MED ORDER — NALOXONE HCL 0.4 MG/ML IJ SOLN: 0.4000 mg | INTRAMUSCULAR | Status: DC | PRN

## 2013-01-31 MED ORDER — FENTANYL CITRATE 0.05 MG/ML IJ SOLN
INTRAMUSCULAR | Status: DC | PRN
  Administered 2013-01-31 (×2): 50 ug via INTRAVENOUS

## 2013-01-31 MED ORDER — HYDROMORPHONE HCL PF 1 MG/ML IJ SOLN: 0.2500 mg | INTRAMUSCULAR | Status: DC | PRN

## 2013-01-31 MED ORDER — SODIUM CHLORIDE 0.9 % IV SOLN
INTRAVENOUS | Status: DC
  Administered 2013-01-31: 100 mL/h via INTRAVENOUS
  Administered 2013-02-01: 05:00:00 via INTRAVENOUS
  Administered 2013-02-02: 09:00:00 50 mL/h via INTRAVENOUS

## 2013-01-31 MED ORDER — SODIUM CHLORIDE 0.9 % IR SOLN
Status: DC | PRN
  Administered 2013-01-31: 3000 mL

## 2013-01-31 MED ORDER — SODIUM CHLORIDE 0.9 % IV SOLN
INTRAVENOUS | Status: AC
  Administered 2013-01-31 – 2013-02-02 (×6): via EPIDURAL
  Filled 2013-01-31 (×11): qty 25

## 2013-01-31 MED ORDER — DEXAMETHASONE 6 MG PO TABS
10.0000 mg | ORAL_TABLET | Freq: Every day | ORAL | Status: AC
  Administered 2013-02-01: 10 mg via ORAL
  Filled 2013-01-31: qty 1

## 2013-01-31 MED ORDER — OXYCODONE HCL 5 MG PO TABS
5.0000 mg | ORAL_TABLET | ORAL | Status: DC | PRN
  Administered 2013-01-31 – 2013-02-01 (×2): 5 mg via ORAL
  Administered 2013-02-01: 15 mg via ORAL
  Administered 2013-02-01 (×2): 5 mg via ORAL
  Administered 2013-02-01 – 2013-02-02 (×3): 15 mg via ORAL
  Administered 2013-02-02: 10 mg via ORAL
  Administered 2013-02-02: 20 mg via ORAL
  Administered 2013-02-02 – 2013-02-03 (×4): 10 mg via ORAL
  Administered 2013-02-03: 17:00:00 20 mg via ORAL
  Administered 2013-02-03: 22:00:00 10 mg via ORAL
  Administered 2013-02-04: 15 mg via ORAL
  Administered 2013-02-04: 02:00:00 10 mg via ORAL
  Administered 2013-02-04: 20 mg via ORAL
  Administered 2013-02-05 (×3): 10 mg via ORAL
  Filled 2013-01-31: qty 2
  Filled 2013-01-31: qty 3
  Filled 2013-01-31 (×2): qty 2
  Filled 2013-01-31: qty 3
  Filled 2013-01-31 (×2): qty 2
  Filled 2013-01-31 (×2): qty 4
  Filled 2013-01-31: qty 3
  Filled 2013-01-31: qty 1
  Filled 2013-01-31: qty 2
  Filled 2013-01-31: qty 4
  Filled 2013-01-31: qty 2
  Filled 2013-01-31: qty 3
  Filled 2013-01-31: qty 2
  Filled 2013-01-31: qty 3
  Filled 2013-01-31: qty 2
  Filled 2013-01-31: qty 3
  Filled 2013-01-31: qty 2
  Filled 2013-01-31: qty 3
  Filled 2013-01-31 (×2): qty 4

## 2013-01-31 MED ORDER — ONDANSETRON HCL 4 MG/2ML IJ SOLN: 4.0000 mg | Freq: Three times a day (TID) | INTRAMUSCULAR | Status: DC | PRN

## 2013-01-31 MED ORDER — MORPHINE SULFATE 2 MG/ML IJ SOLN
1.0000 mg | INTRAMUSCULAR | Status: DC | PRN
  Administered 2013-02-01: 2 mg via INTRAVENOUS
  Administered 2013-02-02: 1 mg via INTRAVENOUS
  Filled 2013-01-31 (×2): qty 1

## 2013-01-31 MED ORDER — METOCLOPRAMIDE HCL 5 MG/ML IJ SOLN: 5.0000 mg | Freq: Three times a day (TID) | INTRAMUSCULAR | Status: DC | PRN

## 2013-01-31 MED ORDER — DOCUSATE SODIUM 100 MG PO CAPS
100.0000 mg | ORAL_CAPSULE | Freq: Two times a day (BID) | ORAL | Status: DC
  Administered 2013-01-31 – 2013-02-05 (×10): 100 mg via ORAL

## 2013-01-31 MED ORDER — DEXAMETHASONE SODIUM PHOSPHATE 10 MG/ML IJ SOLN
10.0000 mg | Freq: Once | INTRAMUSCULAR | Status: AC
  Administered 2013-01-31: 10 mg via INTRAVENOUS

## 2013-01-31 MED ORDER — SCOPOLAMINE 1 MG/3DAYS TD PT72
1.0000 | MEDICATED_PATCH | Freq: Once | TRANSDERMAL | Status: AC
  Administered 2013-01-31: 1.5 mg via TRANSDERMAL
  Filled 2013-01-31: qty 1

## 2013-01-31 MED ORDER — METHOCARBAMOL 500 MG PO TABS
500.0000 mg | ORAL_TABLET | Freq: Four times a day (QID) | ORAL | Status: DC | PRN
  Administered 2013-01-31 – 2013-02-05 (×12): 500 mg via ORAL
  Filled 2013-01-31 (×13): qty 1

## 2013-01-31 MED ORDER — ONDANSETRON HCL 4 MG/2ML IJ SOLN: 4.0000 mg | Freq: Four times a day (QID) | INTRAMUSCULAR | Status: DC | PRN

## 2013-01-31 MED ORDER — METHOCARBAMOL 100 MG/ML IJ SOLN
500.0000 mg | Freq: Four times a day (QID) | INTRAVENOUS | Status: DC | PRN
  Filled 2013-01-31: qty 5

## 2013-01-31 MED ORDER — DIPHENHYDRAMINE HCL 50 MG/ML IJ SOLN: 12.5000 mg | INTRAMUSCULAR | Status: DC | PRN

## 2013-01-31 MED ORDER — METOCLOPRAMIDE HCL 10 MG PO TABS: 5.0000 mg | ORAL_TABLET | Freq: Three times a day (TID) | ORAL | Status: DC | PRN

## 2013-01-31 MED ORDER — TRAMADOL HCL 50 MG PO TABS
50.0000 mg | ORAL_TABLET | Freq: Four times a day (QID) | ORAL | Status: DC | PRN
  Administered 2013-02-02: 21:00:00 100 mg via ORAL
  Filled 2013-01-31: qty 2

## 2013-01-31 MED ORDER — MEPERIDINE HCL 25 MG/ML IJ SOLN: 6.2500 mg | INTRAMUSCULAR | Status: DC | PRN

## 2013-01-31 MED ORDER — SODIUM CHLORIDE 0.9 % IJ SOLN: 3.0000 mL | INTRAMUSCULAR | Status: DC | PRN

## 2013-01-31 MED ORDER — DIPHENHYDRAMINE HCL 50 MG/ML IJ SOLN: 25.0000 mg | INTRAMUSCULAR | Status: DC | PRN

## 2013-01-31 MED ORDER — SODIUM CHLORIDE 0.9 % IV SOLN: INTRAVENOUS | Status: DC

## 2013-01-31 MED ORDER — POLYETHYLENE GLYCOL 3350 17 G PO PACK: 17.0000 g | PACK | Freq: Every day | ORAL | Status: DC | PRN

## 2013-01-31 MED ORDER — MIDAZOLAM HCL 5 MG/5ML IJ SOLN
INTRAMUSCULAR | Status: DC | PRN
  Administered 2013-01-31: 2 mg via INTRAVENOUS

## 2013-01-31 MED ORDER — BUPIVACAINE IN DEXTROSE 0.75-8.25 % IT SOLN
INTRATHECAL | Status: DC | PRN
  Administered 2013-01-31: 2 mL via INTRATHECAL

## 2013-01-31 MED ORDER — PHENOL 1.4 % MT LIQD: 1.0000 | OROMUCOSAL | Status: DC | PRN

## 2013-01-31 MED ORDER — PROMETHAZINE HCL 25 MG/ML IJ SOLN: 6.2500 mg | INTRAMUSCULAR | Status: DC | PRN

## 2013-01-31 MED ORDER — NALBUPHINE HCL 10 MG/ML IJ SOLN: 5.0000 mg | INTRAMUSCULAR | Status: DC | PRN

## 2013-01-31 MED ORDER — ACETAMINOPHEN 500 MG PO TABS
1000.0000 mg | ORAL_TABLET | Freq: Four times a day (QID) | ORAL | Status: AC
  Administered 2013-01-31 – 2013-02-01 (×4): 1000 mg via ORAL
  Filled 2013-01-31 (×5): qty 2

## 2013-01-31 MED ORDER — LACTATED RINGERS IV SOLN
INTRAVENOUS | Status: DC
  Administered 2013-01-31: 1000 mL via INTRAVENOUS
  Administered 2013-01-31: 15:00:00 via INTRAVENOUS

## 2013-01-31 MED ORDER — DIPHENHYDRAMINE HCL 12.5 MG/5ML PO ELIX: 12.5000 mg | ORAL_SOLUTION | ORAL | Status: DC | PRN

## 2013-01-31 MED ORDER — DEXAMETHASONE SODIUM PHOSPHATE 10 MG/ML IJ SOLN
10.0000 mg | Freq: Every day | INTRAMUSCULAR | Status: AC
  Filled 2013-01-31: qty 1

## 2013-01-31 MED ORDER — 0.9 % SODIUM CHLORIDE (POUR BTL) OPTIME
TOPICAL | Status: DC | PRN
  Administered 2013-01-31: 1000 mL

## 2013-01-31 MED ORDER — VANCOMYCIN HCL IN DEXTROSE 1-5 GM/200ML-% IV SOLN
1000.0000 mg | Freq: Two times a day (BID) | INTRAVENOUS | Status: AC
  Administered 2013-02-01: 01:00:00 1000 mg via INTRAVENOUS
  Filled 2013-01-31: qty 200

## 2013-01-31 MED ORDER — EPHEDRINE SULFATE 50 MG/ML IJ SOLN
INTRAMUSCULAR | Status: DC | PRN
  Administered 2013-01-31: 7.5 mg via INTRAVENOUS

## 2013-01-31 MED ORDER — NALOXONE HCL 1 MG/ML IJ SOLN
1.0000 ug/kg/h | INTRAVENOUS | Status: DC | PRN
  Filled 2013-01-31: qty 2

## 2013-01-31 MED ORDER — BISACODYL 10 MG RE SUPP: 10.0000 mg | Freq: Every day | RECTAL | Status: DC | PRN

## 2013-01-31 MED ORDER — METOCLOPRAMIDE HCL 5 MG/ML IJ SOLN: 10.0000 mg | Freq: Three times a day (TID) | INTRAMUSCULAR | Status: DC | PRN

## 2013-01-31 SURGICAL SUPPLY — 54 items
BAG SPEC THK2 15X12 ZIP CLS (MISCELLANEOUS) ×2
BAG ZIPLOCK 12X15 (MISCELLANEOUS) ×4 IMPLANT
BANDAGE ELASTIC 6 VELCRO ST LF (GAUZE/BANDAGES/DRESSINGS) ×4 IMPLANT
BANDAGE ESMARK 6X9 LF (GAUZE/BANDAGES/DRESSINGS) ×2 IMPLANT
BLADE SAG 18X100X1.27 (BLADE) ×2 IMPLANT
BLADE SAW SGTL 11.0X1.19X90.0M (BLADE) ×2 IMPLANT
BLADE SURG SZ10 CARB STEEL (BLADE) ×4 IMPLANT
BNDG CMPR 9X6 STRL LF SNTH (GAUZE/BANDAGES/DRESSINGS) ×2
BNDG COHESIVE 6X5 TAN STRL LF (GAUZE/BANDAGES/DRESSINGS) ×2 IMPLANT
BNDG ESMARK 6X9 LF (GAUZE/BANDAGES/DRESSINGS) ×4
BOWL SMART MIX CTS (DISPOSABLE) ×4 IMPLANT
CAPT RP KNEE ×2 IMPLANT
CEMENT HV SMART SET (Cement) ×8 IMPLANT
CUFF TOURN SGL QUICK 34 (TOURNIQUET CUFF) ×4
CUFF TRNQT CYL 34X4X40X1 (TOURNIQUET CUFF) ×2 IMPLANT
DRAPE EXTREMITY BILATERAL (DRAPE) ×2 IMPLANT
DRAPE INCISE IOBAN 66X45 STRL (DRAPES) ×2 IMPLANT
DRAPE POUCH INSTRU U-SHP 10X18 (DRAPES) ×2 IMPLANT
DRAPE U-SHAPE 47X51 STRL (DRAPES) ×6 IMPLANT
DRSG ADAPTIC 3X8 NADH LF (GAUZE/BANDAGES/DRESSINGS) ×4 IMPLANT
DRSG PAD ABDOMINAL 8X10 ST (GAUZE/BANDAGES/DRESSINGS) ×4 IMPLANT
DURAPREP 26ML APPLICATOR (WOUND CARE) ×4 IMPLANT
ELECT REM PT RETURN 9FT ADLT (ELECTROSURGICAL) ×2
ELECTRODE REM PT RTRN 9FT ADLT (ELECTROSURGICAL) ×1 IMPLANT
EVACUATOR 1/8 PVC DRAIN (DRAIN) ×4 IMPLANT
FACESHIELD LNG OPTICON STERILE (SAFETY) ×14 IMPLANT
GLOVE BIO SURGEON STRL SZ7.5 (GLOVE) ×6 IMPLANT
GLOVE BIO SURGEON STRL SZ8 (GLOVE) ×4 IMPLANT
GLOVE BIOGEL PI IND STRL 8 (GLOVE) ×3 IMPLANT
GLOVE BIOGEL PI INDICATOR 8 (GLOVE) ×2
GOWN STRL NON-REIN LRG LVL3 (GOWN DISPOSABLE) ×2 IMPLANT
GOWN STRL REIN XL XLG (GOWN DISPOSABLE) ×3 IMPLANT
HANDPIECE INTERPULSE COAX TIP (DISPOSABLE) ×2
IMMOBILIZER KNEE 20 (SOFTGOODS) ×4
IMMOBILIZER KNEE 20 THIGH 36 (SOFTGOODS) ×2 IMPLANT
KIT BASIN OR (CUSTOM PROCEDURE TRAY) ×2 IMPLANT
MANIFOLD NEPTUNE II (INSTRUMENTS) ×2 IMPLANT
NS IRRIG 1000ML POUR BTL (IV SOLUTION) ×2 IMPLANT
PACK TOTAL JOINT (CUSTOM PROCEDURE TRAY) ×2 IMPLANT
PADDING CAST COTTON 6X4 STRL (CAST SUPPLIES) ×11 IMPLANT
SET HNDPC FAN SPRY TIP SCT (DISPOSABLE) ×1 IMPLANT
SPONGE GAUZE 4X4 12PLY (GAUZE/BANDAGES/DRESSINGS) ×3 IMPLANT
SPONGE LAP 18X18 X RAY DECT (DISPOSABLE) ×2 IMPLANT
STOCKINETTE 8 INCH (MISCELLANEOUS) ×2 IMPLANT
STRIP CLOSURE SKIN 1/2X4 (GAUZE/BANDAGES/DRESSINGS) ×8 IMPLANT
SUCTION FRAZIER 12FR DISP (SUCTIONS) ×2 IMPLANT
SUT MNCRL AB 4-0 PS2 18 (SUTURE) ×4 IMPLANT
SUT VIC AB 2-0 CT1 27 (SUTURE) ×12
SUT VIC AB 2-0 CT1 TAPERPNT 27 (SUTURE) ×6 IMPLANT
SUT VLOC 180 0 24IN GS25 (SUTURE) ×4 IMPLANT
TOWEL OR 17X26 10 PK STRL BLUE (TOWEL DISPOSABLE) ×3 IMPLANT
TRAY FOLEY METER SIL LF 16FR (CATHETERS) ×1 IMPLANT
WATER STERILE IRR 1500ML POUR (IV SOLUTION) ×3 IMPLANT
WRAP KNEE MAXI GEL POST OP (GAUZE/BANDAGES/DRESSINGS) ×6 IMPLANT

## 2013-01-31 NOTE — Transfer of Care (Signed)
Immediate Anesthesia Transfer of Care Note  Patient: [name] [name]  Procedure(s) Performed: Procedure(s) (LRB): TOTAL KNEE BILATERAL (Bilateral)  Patient Location: PACU  Anesthesia Type: Spinal and Epidural  Level of Consciousness: sedated, patient cooperative and responds to stimulaton  Airway & Oxygen Therapy: Patient Spontanous Breathing and Patient connected to face mask oxgen  Post-op Assessment: Report given to PACU RN and Post -op Vital signs reviewed and stable  Post vital signs: Reviewed and stable  Complications: No apparent anesthesia complications

## 2013-01-31 NOTE — H&P (View-Only) (Signed)
[name] [name]  DOB: 04-08-1951 Married / Language: [name] [name] / Race: White Male  Date of Admission:  01/31/2013   Chief Complaint:  Bilateral Knee Pain  History of Present Illness([name] [name], [name] [name]; 01/04/2013 3:06 PM) The patient is a 62 year old male who comes in for a preoperative History and Physical. The patient is scheduled for a bilateral total knee arthroplasty to be performed by Dr. Gus [name]. [name], MD at [facility] on 01/31/2013. The patient is a 62 year old male who presents for follow up of their knee. 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 knee. Symptoms reported today include: pain, pain after sitting, swelling, aching and throbbing. The patient feels that they are doing poorly and report their pain level to be 3 / 10. Current treatment includes: activity modification and NSAIDs. The following medication has been used for pain control: antiinflammatory medication. [name]. He was a previous patient of Dr. Marlowe [name] and is coming in transferring his care over to Dr. Lequita [name]. He describes pain in both knees. The right knee seems to be the more problematic of the two. He had an injury back in [place] about 43 years ago, when he was jumping off the back of a truck with his full rucksack and was loaded down with ammo. and weapons. He had pain in the knee at that time and [place]. He underwent an arthrotomy with staple repair of the torn medial collateral ligament, also excision of a bucket-handle tear. After he finished college, he eventually was seen by Dr. Marlowe [name] and told that he was developing arthritis in that knee and would need surgery at a later point. Over the years, the arthritis in that knee has progressively gotten worse. The left knee has also been seen and evaluated by Dr.  Simonne [name]. He had a knee scope back in 2009 for this, and it's been hurting a little bit more which he attributes to compensation because of the right knee. Both knees have steadily gotten worse over many years, but over the past 5 years, both knees have been much more progressive. He has intermittent episodes of flare ups. His last horrible flare up was July of last year. Prior to that he had been doing exercises. He was a member of the [facility] [employer] and was going for swimming and weight lifting. Following that bad flare up in July of 2013, he, unfortunately, stopped doing his exercises because of pain. Since that time he's had increasing pain with walking. Going up and down stairs is the worst. He has also had some trouble getting in and out of his car when he had a sedan, but now he has an SUV and it is a little bit easier sitting up higher. Both knees have popping and some buckling, but he denies any locking. He has noticed some swelling in them. He's never had injections in either knee. He is a friend of [name] [name], who is a patient of Dr. Lequita [name], and has recommended him to [name] over to see Dr. Lequita [name] about possible need for surgery. He states he has sedentary type work and works at a [employer] and has a lot of sedentary stiffness. It's very difficult when he gets up and the first few steps are difficult. He also has been told he had heel spurs in the  past which have interfered with his walking, and it's been much more difficult over the past several years to exercise, and he had to stop going to the [facility] a year ago and it's been very difficult to try to do any weight loss. He states that he feels like if he lost some of the weight his knees would feel better but it's very difficult at this time. When asked, he said he's never had injections in the knees before.  His knees continue to be a problem and he is ready to get the knees fixed. Risks and benefits of the surgery have been discussed with the  patient and they elect to proceed with surgery.  There are on active contraindications to upcoming procedure such as ongoing infection or progressive neurological disease.   Problem List Primary osteoarthritis of both knees   Allergies Penicillins. Swelling.   Family History Cerebrovascular Accident. father Diabetes Mellitus. grandmother fathers side   Social History Living situation. live with spouse Marital status. married Number of flights of stairs before winded. greater than 5 Drug/Alcohol Rehab (Previously). no Exercise. Exercises weekly; does gym / weights Illicit drug use. no Pain Contract. no Tobacco / smoke exposure. no Tobacco use. never smoker Children. 2 Current work status. working full time Drug/Alcohol Rehab (Currently). no Alcohol use. current drinker; drinks wine; only occasionally per week   Past Surgical History Arthroscopy of Knee. left Cataract Surgery. bilateral Gallbladder Surgery. laporoscopic   Medical History Gastroesophageal Reflux Disease   Review of Systems General:Not Present- Chills, Fever, Night Sweats, Fatigue, Weight Gain, Weight Loss and Memory Loss. Skin:Not Present- Hives, Itching, Rash, Eczema and Lesions. HEENT:Not Present- Tinnitus, Headache, Double Vision, Visual Loss, Hearing Loss and Dentures. Respiratory:Not Present- Shortness of breath with exertion, Shortness of breath at rest, Allergies, Coughing up blood and Chronic Cough. Cardiovascular:Not Present- Chest Pain, Racing/skipping heartbeats, Difficulty Breathing Lying Down, Murmur, Swelling and Palpitations. Gastrointestinal:Not Present- Bloody Stool, Heartburn, Abdominal Pain, Vomiting, Nausea, Constipation, Diarrhea, Difficulty Swallowing, Jaundice and Loss of appetitie. Male Genitourinary:Present- Frequency and Urinating at Night. Not Present- Urinary frequency, Blood in Urine, Weak urinary stream, Discharge, Flank Pain, Incontinence,  Painful Urination, Urgency and Urinary Retention. Musculoskeletal:Present- Muscle Pain, Joint Swelling and Joint Pain. Not Present- Muscle Weakness, Back Pain, Morning Stiffness and Spasms. Neurological:Not Present- Tremor, Dizziness, Blackout spells, Paralysis, Difficulty with balance and Weakness. Psychiatric:Not Present- Insomnia.   Vitals Weight: 280 lb Height: 71 in Body Surface Area: 2.52 m Body Mass Index: 39.05 kg/m Pulse: 64 (Regular) Resp.: 16 (Unlabored) BP: 140/82 (Sitting, Left Arm, Standard)    Physical Exam The physical exam findings are as follows:   General Mental Status - Alert, cooperative and good historian. General Appearance- pleasant. Not in acute distress. Orientation- Oriented X3. Build & Nutrition- Well nourished and Well developed.   Head and Neck Head- normocephalic, atraumatic . Neck Global Assessment- supple. no bruit auscultated on the right and no bruit auscultated on the left.   Eye Pupil- Bilateral- Regular and Round. Motion- Bilateral- EOMI.   Chest and Lung Exam Auscultation: Breath sounds:- clear at anterior chest wall and - clear at posterior chest wall. Adventitious sounds:- No Adventitious sounds.   Cardiovascular Auscultation:Rhythm- Regular rate and rhythm. Heart Sounds- S1 WNL and S2 WNL. Murmurs & Other Heart Sounds:Auscultation of the heart reveals - No Murmurs.   Abdomen Inspection:Contour- Generalized moderate distention. Palpation/Percussion:Tenderness- Abdomen is non-tender to palpation. Rigidity (guarding)- Abdomen is soft. Auscultation:Auscultation of the abdomen reveals - Bowel sounds normal.   Male Genitourinary Not done,  not pertinent to present illness  Musculoskeletal On exam, right knee is examined. He has full extension with flexion back to 110 actively, 115 passively. He has severe marked crepitus on PROM. I do not appreciate any effusion. The knee is  stable with varus and valgus stressing. He does have approximately a 3-4 inch incision over the medial knee, over the medial side of the right knee where previous arthrotomy was done. He is tender more medial than lateral on palpation. Left knee ROM is 0-120. Moderate crepitus noted. No effusion. Knee is stable with varus and valgus stressing. Tender more medial than lateral on palpation.  RADIOGRAPHS: We have an AP of the standing bilateral knees and a lateral view of the right knee. Standing AP view of the right knee shows the knee has gone into varus mal-alignment with near complete loss of the joint space medially but already bone on bone throughout most of the joint line, moderate spurring noted medially and laterally on the femur and significant lateral spurring on the tibia. He does have a staple in the upper portion of the medial epicondyle.  AP view of the left knee shows near bone on bone medial compartment. A lateral view of the right knee confirms bone on bone throughout the knee on the weight bearing surface. Posterior tibial and femoral spurs are noted and significantly patellofemoral compartment with noted spurring.  Assessment & Plan Primary osteoarthritis of both knees (715.16)  Note: Plan is for a Bilateral Total Knee Replacements by Dr. Lequita [name].  Plan is to go home.  The patient does not have any contraindications and will recieve TXA (tranexamic acid) prior to surgery.  Time spent ~ 20 minutes  Signed electronically by Lauraine Rinne, [name] [name]

## 2013-01-31 NOTE — Interval H&P Note (Signed)
History and Physical Interval Note:  01/31/2013 12:17 PM  [name] [name]  has presented today for surgery, with the diagnosis of OSTEOARTHRITIS  BILATERAL KNEES  The various methods of treatment have been discussed with the patient and family. After consideration of risks, benefits and other options for treatment, the patient has consented to  Procedure(s): TOTAL KNEE BILATERAL (Bilateral) as a surgical intervention .  The patient's history has been reviewed, patient examined, no change in status, stable for surgery.  I have reviewed the patient's chart and labs.  Questions were answered to the patient's satisfaction.     Loanne Drilling

## 2013-01-31 NOTE — Anesthesia Preprocedure Evaluation (Signed)
Anesthesia Evaluation  Patient identified by MRN, date of birth, ID band Patient awake    Reviewed: Allergy & Precautions, H&P , NPO status , Patient's Chart, lab work & pertinent test results  Airway Mallampati: II TM Distance: >3 FB Neck ROM: Full    Dental no notable dental hx.    Pulmonary neg pulmonary ROS,  breath sounds clear to auscultation  Pulmonary exam normal       Cardiovascular negative cardio ROS  Rhythm:Regular Rate:Normal     Neuro/Psych negative neurological ROS  negative psych ROS   GI/Hepatic negative GI ROS, Neg liver ROS,   Endo/Other  Morbid obesity  Renal/GU negative Renal ROS  negative genitourinary   Musculoskeletal negative musculoskeletal ROS (+)   Abdominal (+) + obese,   Peds negative pediatric ROS (+)  Hematology negative hematology ROS (+)   Anesthesia Other Findings   Reproductive/Obstetrics negative OB ROS                           Anesthesia Physical Anesthesia Plan  ASA: III  Anesthesia Plan: Spinal and Epidural   Post-op Pain Management:    Induction: Intravenous  Airway Management Planned:   Additional Equipment:   Intra-op Plan:   Post-operative Plan: Extubation in OR  Informed Consent: I have reviewed the patients History and Physical, chart, labs and discussed the procedure including the risks, benefits and alternatives for the proposed anesthesia with the patient or authorized representative who has indicated his/her understanding and acceptance.   Dental advisory given  Plan Discussed with: CRNA  Anesthesia Plan Comments: (Discussed risks/benefits of spinal including headache, backache, failure, bleeding, infection, and nerve damage. Patient consents to spinal. Questions answered. Coagulation studies and platelet count acceptable.)        Anesthesia Quick Evaluation

## 2013-01-31 NOTE — Op Note (Signed)
Pre-operative diagnosis- Osteoarthritis  Bilateral knee(s)  Post-operative diagnosis- Osteoarthritis Bilateral knee(s)  Procedure-  Bilateral  Total Knee Arthroplasty  Surgeon- [name]. Quinnton Bury, MD  Assistant- Avel Peace, PA-C   Anesthesia-  Spinal and Epidural EBL-* No blood loss amount entered *  Drains Hemovac x 1 each side  Tourniquet time-  Total Tourniquet Time Documented: Thigh (laterality) - 35 minutes Thigh (laterality) - 38 minutes Total: Thigh (laterality) - 73 minutes    Complications- None  Condition-PACU - hemodynamically stable.   Brief Clinical Note  [name] [name] is a 62 y.o. year old male with end stage OA of both knees with progressively worsening pain and dysfunction. He has constant pain, with activity and at rest and significant functional deficits with difficulties even with ADLs. He has had extensive non-op management including analgesics, injections of cortisone, and home exercise program, but remains in significant pain with significant dysfunction. Radiographs show severe medial and patellofemoral bone on bone with large osteophytes.Options for staged vs bilateral total knee arthroplasty were discussed including pros/cons, procedure risks and potential complications associated with each and he elects to pursue bilateral total knee arthroplasty. He presents now for bilateral Total Knee Arthroplasty.    Procedure in detail---   The patient is brought into the operating room and positioned supine on the operating table. After successful administration of  Spinal and Epidural,   a tourniquet is placed high on the  Bilateral thigh(s) and the lower extremity is prepped and draped in the usual sterile fashion. Time out is performed by the operating team and then the  Right lower extremity is wrapped in Esmarch, knee flexed and the tourniquet inflated to 300 mmHg.       A midline incision is made with a ten blade through the subcutaneous tissue to the level of the  extensor mechanism. A fresh blade is used to make a medial parapatellar arthrotomy. Soft tissue over the proximal medial tibia is subperiosteally elevated to the joint line with a knife and into the semimembranosus bursa with a Cobb elevator. Soft tissue over the proximal lateral tibia is elevated with attention being paid to avoiding the patellar tendon on the tibial tubercle. The patella is everted, knee flexed 90 degrees and the ACL and PCL are removed. Findings are bone on bone medial and patellofemoral with massive global osteophytes        The drill is used to create a starting hole in the distal femur and the canal is thoroughly irrigated with sterile saline to remove the fatty contents. The 5 degree Right  valgus alignment guide is placed into the femoral canal and the distal femoral cutting block is pinned to remove 10 mm off the distal femur. Resection is made with an oscillating saw.      The tibia is subluxed forward and the menisci are removed. The extramedullary alignment guide is placed referencing proximally at the medial aspect of the tibial tubercle and distally along the second metatarsal axis and tibial crest. The block is pinned to remove 2mm off the more deficient medial  side. Resection is made with an oscillating saw. Size 5is the most appropriate size for the tibia and the proximal tibia is prepared with the modular drill and keel punch for that size.      The femoral sizing guide is placed and size 5 is most appropriate. Rotation is marked off the epicondylar axis and confirmed by creating a rectangular flexion gap at 90 degrees. The size 5 cutting block is  pinned in this rotation and the anterior, posterior and chamfer cuts are made with the oscillating saw. The intercondylar block is then placed and that cut is made.      Trial size 5 tibial component, trial size 5 posterior stabilized femur and a 15  mm posterior stabilized rotating platform insert trial is placed. Full extension is  achieved with excellent varus/valgus and anterior/posterior balance throughout full range of motion. The patella is everted and thickness measured to be 27  mm. Free hand resection is taken to 15 mm, a 41 template is placed, lug holes are drilled, trial patella is placed, and it tracks normally. Osteophytes are removed off the posterior femur with the trial in place. All trials are removed and the cut bone surfaces prepared with pulsatile lavage. Cement is mixed and once ready for implantation, the size 5 tibial implant, size  5 posterior stabilized femoral component, and the size 41 patella are cemented in place and the patella is held with the clamp. The trial insert is placed and the knee held in full extension.  All extruded cement is removed and once the cement is hard the permanent 15 mm posterior stabilized rotating platform insert is placed into the tibial tray.      The wound is copiously irrigated with saline solution and the extensor mechanism closed over a hemovac drain with #1 V-locsuture. The tourniquet is released for a total tourniquet time of 34  minutes. Flexion against gravity is 140 degrees and the patella tracks normally. Subcutaneous tissue is closed with 2.0 vicryl and subcuticular with running 4.0 Monocryl. The left lower extremity is then addressed.           The left lower extremity is wrapped in Esmarch, knee flexed and the tourniquet inflated to 300 mmHg.       A midline incision is made with a ten blade through the subcutaneous tissue to the level of the extensor mechanism. A fresh blade is used to make a medial parapatellar arthrotomy. Soft tissue over the proximal medial tibia is subperiosteally elevated to the joint line with a knife and into the semimembranosus bursa with a Cobb elevator. Soft tissue over the proximal lateral tibia is elevated with attention being paid to avoiding the patellar tendon on the tibial tubercle. The patella is everted, knee flexed 90 degrees and the  ACL and PCL are removed. Findings are Findings are bone on bone medial and patellofemoral with massive global osteophytes           The drill is used to create a starting hole in the distal femur and the canal is thoroughly irrigated with sterile saline to remove the fatty contents. The 5 degree Left  valgus alignment guide is placed into the femoral canal and the distal femoral cutting block is pinned to remove 10 mm off the distal femur. Resection is made with an oscillating saw.      The tibia is subluxed forward and the menisci are removed. The extramedullary alignment guide is placed referencing proximally at the medial aspect of the tibial tubercle and distally along the second metatarsal axis and tibial crest. The block is pinned to remove 2mm off the more deficient medial  side. Resection is made with an oscillating saw. Size 5is the most appropriate size for the tibia and the proximal tibia is prepared with the modular drill and keel punch for that size.      The femoral sizing guide is placed and size 5 is most appropriate.  Rotation is marked off the epicondylar axis and confirmed by creating a rectangular flexion gap at 90 degrees. The size 5 cutting block is pinned in this rotation and the anterior, posterior and chamfer cuts are made with the oscillating saw. The intercondylar block is then placed and that cut is made.      Trial size 5 tibial component, trial size 5 posterior stabilized femur and a 12.5  mm posterior stabilized rotating platform insert trial is placed. Full extension is achieved with excellent varus/valgus and anterior/posterior balance throughout full range of motion. The patella is everted and thickness measured to be 2  mm. Free hand resection is taken to 15 mm, a 41 template is placed, lug holes are drilled, trial patella is placed, and it tracks normally. Osteophytes are removed off the posterior femur with the trial in place. All trials are removed and the cut bone surfaces  prepared with pulsatile lavage. Cement is mixed and once ready for implantation, the size 5 tibial implant, size  5 posterior stabilized femoral component, and the size 41 patella are cemented in place and the patella is held with the clamp. The trial insert is placed and the knee held in full extension.  All extruded cement is removed and once the cement is hard the permanent 12.5 mm posterior stabilized rotating platform insert is placed into the tibial tray.      The wound is copiously irrigated with saline solution and the extensor mechanism closed over a hemovac drain with #1 V-loc suture. The tourniquet is released for a total tourniquet time of 37  minutes. Flexion against gravity is 140 degrees and the patella tracks normally. Subcutaneous tissue is closed with 2.0 vicryl and subcuticular with running 4.0 Monocryl. The incisions are cleaned and dried and steri-strips and  bulky sterile dressings are applied. The limsb are placed into knee immobilizers and the patient is awakened and transported to recovery in stable condition.       Please note that a surgical assistant was a medical necessity for this procedure in order to perform it in a safe and expeditious manner. Surgical assistant was necessary to retract the ligaments and vital neurovascular structures to prevent injury to them and also necessary for proper positioning of the limb to allow for anatomic placement of the prosthesis.   [name] Krisanne Lich, MD    01/31/2013, 3:21 PM

## 2013-01-31 NOTE — Anesthesia Procedure Notes (Addendum)
Spinal  Patient location during procedure: OR Staffing Anesthesiologist: Azell Der Performed by: anesthesiologist  Preanesthetic Checklist Completed: patient identified, site marked, surgical consent, pre-op evaluation, timeout performed, IV checked, risks and benefits discussed and monitors and equipment checked Spinal Block Patient position: sitting Prep: Betadine Patient monitoring: heart rate, continuous pulse ox and blood pressure Injection technique: single-shot Needle Needle type: Whitacre  Needle gauge: 25 G Needle length: 12.7 cm Additional Notes Expiration date of kit checked and confirmed. Patient tolerated procedure well, without complications. No paresthesia. CSF clear.    Epidural Patient location during procedure: OR  Staffing Performed by: anesthesiologist   Preanesthetic Checklist Completed: patient identified, site marked, surgical consent, pre-op evaluation, timeout performed, IV checked, risks and benefits discussed, monitors and equipment checked and post-op pain management  Epidural Patient position: sitting Prep: Betadine Patient monitoring: heart rate, continuous pulse ox and blood pressure Approach: midline Injection technique: LOR saline  Needle:  Needle type: Hustead  Needle gauge: 18 G Needle length: 9 cm and 9 Catheter type: closed end flexible Catheter size: 20 Guage Catheter at skin depth: 11 cm Test dose: negative and 1.5% lidocaine  Additional Notes Test dose 1.5% Lidocaine with epi 1:200,000  Patient tolerated the insertion well without complications.Reason for block:post-op pain management

## 2013-01-31 NOTE — Anesthesia Postprocedure Evaluation (Signed)
  Anesthesia Post-op Note  Patient: [name] [name]  Procedure(s) Performed: Procedure(s) (LRB): TOTAL KNEE BILATERAL (Bilateral)  Patient Location: PACU  Anesthesia Type: Spinal and epidural   Level of Consciousness: awake and alert   Airway and Oxygen Therapy: Patient Spontanous Breathing  Post-op Pain: mild  Post-op Assessment: Post-op Vital signs reviewed, Patient's Cardiovascular Status Stable, Respiratory Function Stable, Patent Airway and No signs of Nausea or vomiting  Last Vitals:  Filed Vitals:   01/31/13 1645  BP: 120/76  Pulse: 63  Temp:   Resp: 10    Post-op Vital Signs: stable   Complications: No apparent anesthesia complications. t-10 level before epidural test dose. Negative test dose. No pain. BP stable. To floor on epidural drip.

## 2013-01-31 NOTE — Preoperative (Signed)
Beta Blockers   Reason not to administer Beta Blockers:Not Applicable 

## 2013-02-01 ENCOUNTER — Encounter (HOSPITAL_COMMUNITY): Payer: Self-pay | Admitting: Orthopedic Surgery

## 2013-02-01 DIAGNOSIS — M1710 Unilateral primary osteoarthritis, unspecified knee: Secondary | ICD-10-CM

## 2013-02-01 DIAGNOSIS — E871 Hypo-osmolality and hyponatremia: Secondary | ICD-10-CM

## 2013-02-01 DIAGNOSIS — D62 Acute posthemorrhagic anemia: Secondary | ICD-10-CM

## 2013-02-01 DIAGNOSIS — I959 Hypotension, unspecified: Secondary | ICD-10-CM

## 2013-02-01 DIAGNOSIS — Z96659 Presence of unspecified artificial knee joint: Secondary | ICD-10-CM

## 2013-02-01 LAB — CBC
Hemoglobin: 11.8 g/dL — ABNORMAL LOW (ref 13.0–17.0)
MCH: 29.1 pg (ref 26.0–34.0)
MCHC: 34.1 g/dL (ref 30.0–36.0)
MCV: 85.4 fL (ref 78.0–100.0)
RBC: 4.05 MIL/uL — ABNORMAL LOW (ref 4.22–5.81)

## 2013-02-01 LAB — BASIC METABOLIC PANEL
BUN: 15 mg/dL (ref 6–23)
CO2: 25 mEq/L (ref 19–32)
Calcium: 8.4 mg/dL (ref 8.4–10.5)
Creatinine, Ser: 1.04 mg/dL (ref 0.50–1.35)
GFR calc non Af Amer: 74 mL/min — ABNORMAL LOW (ref >90)
Glucose, Bld: 153 mg/dL — ABNORMAL HIGH (ref 70–99)
Sodium: 131 mEq/L — ABNORMAL LOW (ref 135–145)

## 2013-02-01 MED ORDER — WARFARIN SODIUM 4 MG PO TABS
4.0000 mg | ORAL_TABLET | Freq: Once | ORAL | Status: AC
  Administered 2013-02-01: 19:00:00 4 mg via ORAL
  Filled 2013-02-01: qty 1

## 2013-02-01 MED ORDER — WARFARIN - PHARMACIST DOSING INPATIENT: Freq: Every day | Status: DC

## 2013-02-01 MED ORDER — COUMADIN BOOK
Freq: Once | Status: AC
  Administered 2013-02-01: 1
  Filled 2013-02-01: qty 1

## 2013-02-01 MED ORDER — SODIUM CHLORIDE 0.9 % IV SOLN: 18.0000 mL/h | INTRAVENOUS | Status: DC

## 2013-02-01 MED ORDER — WARFARIN VIDEO: Freq: Once | Status: DC

## 2013-02-01 MED ORDER — SODIUM CHLORIDE 0.9 % IV BOLUS (SEPSIS)
500.0000 mL | Freq: Once | INTRAVENOUS | Status: AC
  Administered 2013-02-01: 500 mL via INTRAVENOUS

## 2013-02-01 NOTE — Consult Note (Signed)
Physical Medicine and Rehabilitation Consult  Reason for Consult: Endstage DJD bilateral knees Referring Physician: Dr. [name]   HPI: [name] [name] is a 62 y.o. male with history of RLS, endstage OA bilateral knees who elected to undergo B-TKR on 01/31/13 by Dr. [name]. Post op WBAT and on coumadin for DVT prophylaxis. On epidural for pain management. PT/OT evaluations to be done today.    ROS Past Medical History  Diagnosis Date  . Restless leg syndrome   . Arthritis    Past Surgical History  Procedure Laterality Date  . Right leg surgery       torn ligament and cartilage in 1972   . Torn retina       1995   . Cataract srugery       bilateral   . Left leg surgery     . Cholecystectomy     History reviewed. No pertinent family history.  Social History:  reports that he has never smoked. He has never used smokeless tobacco. He reports that he does not drink alcohol or use illicit drugs.   Allergies  Allergen Reactions  . Penicillins     REACTION: swelling   No prescriptions prior to admission    Home: Home Living Family/patient expects to be discharged to:: Private residence Living Arrangements: Spouse/significant other  Functional History:   Functional Status:  Mobility:          ADL:    Cognition: Cognition Orientation Level: Oriented X4    Blood pressure 99/52, pulse 76, temperature 97.7 F (36.5 C), temperature source Oral, resp. rate 18, height 5\' 11"  (1.803 m), weight 130.636 kg (288 lb), SpO2 96.00%.  Physical Exam  Nursing note and vitals reviewed. Constitutional: He appears well-developed.  obese  HENT:  Head: Normocephalic and atraumatic.  Eyes: Conjunctivae and EOM are normal. Pupils are equal, round, and reactive to light.  Neck: Normal range of motion.  Cardiovascular: Normal rate, regular rhythm and normal heart sounds.   No murmur heard. Pulmonary/Chest: Effort normal and breath sounds normal.  Abdominal: Bowel sounds are  normal. He exhibits distension. There is no tenderness. There is no rebound.  Musculoskeletal:       Right knee: He exhibits decreased range of motion, swelling and effusion.       Left knee: He exhibits decreased range of motion, swelling and effusion.  Able to SLR LLE only Motor 5/5 in BUE FROM BUE 3- Bilateral Ankle DF/PF  Neurological: No cranial nerve deficit. He exhibits normal muscle tone.  Psychiatric: He is slowed.  Mildly lethargic but answers questions, oriented to person adn place    Results for orders placed during the hospital encounter of 01/31/13 (from the past 24 hour(s))  TYPE AND SCREEN     Status: None   Collection Time    01/31/13 10:50 AM      Result Value Range   ABO/RH(D) B POS     Antibody Screen NEG     Sample Expiration 02/03/2013    ABO/RH     Status: None   Collection Time    01/31/13 11:00 AM      Result Value Range   ABO/RH(D) B POS    CBC     Status: Abnormal   Collection Time    02/01/13  4:10 AM      Result Value Range   WBC 13.6 (*) 4.0 - 10.5 K/uL   RBC 4.05 (*) 4.22 - 5.81 MIL/uL   Hemoglobin 11.8 (*) 13.0 - 17.0  g/dL   HCT 16.1 (*) 09.6 - 04.5 %   MCV 85.4  78.0 - 100.0 fL   MCH 29.1  26.0 - 34.0 pg   MCHC 34.1  30.0 - 36.0 g/dL   RDW 40.9  81.1 - 91.4 %   Platelets 194  150 - 400 K/uL  BASIC METABOLIC PANEL     Status: Abnormal   Collection Time    02/01/13  4:10 AM      Result Value Range   Sodium 131 (*) 135 - 145 mEq/L   Potassium 4.2  3.5 - 5.1 mEq/L   Chloride 100  96 - 112 mEq/L   CO2 25  19 - 32 mEq/L   Glucose, Bld 153 (*) 70 - 99 mg/dL   BUN 15  6 - 23 mg/dL   Creatinine, Ser 7.82  0.50 - 1.35 mg/dL   Calcium 8.4  8.4 - 95.6 mg/dL   GFR calc non Af Amer 74 (*) >90 mL/min   GFR calc Af Amer 86 (*) >90 mL/min  PROTIME-INR     Status: None   Collection Time    02/01/13  4:10 AM      Result Value Range   Prothrombin Time 15.0  11.6 - 15.2 seconds   INR 1.21  0.00 - 1.49   No results  found.  Assessment/Plan: Diagnosis: Bilateral TKR POD #1 1. Does the need for close, 24 hr/day medical supervision in concert with the patient's rehab needs make it unreasonable for this patient to be served in a less intensive setting? Potentially 2. Co-Morbidities requiring supervision/potential complications: ABLA, Obesity, post op hypotension 3. Due to bladder management, bowel management, safety, skin/wound care, disease management, medication administration, pain management and patient education, does the patient require 24 hr/day rehab nursing? Yes 4. Does the patient require coordinated care of a physician, rehab nurse, PT (1-2 hrs/day, 5 days/week) and OT (1-2 hrs/day, 5 days/week) to address physical and functional deficits in the context of the above medical diagnosis(es)? Potentially Addressing deficits in the following areas: balance, endurance, locomotion, strength, transferring, bathing, dressing and toileting 5. Can the patient actively participate in an intensive therapy program of at least 3 hrs of therapy per day at least 5 days per week? Potentially 6. The potential for patient to make measurable gains while on inpatient rehab is good 7. Anticipated functional outcomes upon discharge from inpatient rehab are Mod I with PT, Mod I ADL with OT, NA with SLP. 8. Estimated rehab length of stay to reach the above functional goals is: 7-10 days 9. Does the patient have adequate social supports to accommodate these discharge functional goals? Yes 10. Anticipated D/C setting: Home 11. Anticipated post D/C treatments: HH therapy 12. Overall Rehab/Functional Prognosis: good  RECOMMENDATIONS: This patient's condition is appropriate for continued rehabilitative care in the following setting: Anticipate CIR, Rehab RN to f/u on PT progress once epidural catheter removed Patient has agreed to participate in recommended program. Yes Note that insurance prior authorization may be required for  reimbursement for recommended care.  Comment: Not ready, pt hasn't tried OOB yet. See above    02/01/2013

## 2013-02-01 NOTE — Progress Notes (Deleted)
Pt stable, scripts, d/c instructions, and equipment given with no questions/concerns voiced by pt or husband.  Pt transported via wheelchair to private vehicle by NT and husband.

## 2013-02-01 NOTE — Progress Notes (Signed)
ANTICOAGULATION CONSULT NOTE - Initial Consult  Pharmacy Consult for Coumadin Indication: VTE prophylaxis  Allergies  Allergen Reactions  . Penicillins     REACTION: swelling    Patient Measurements: Height: 5\' 11"  (180.3 cm) Weight: 288 lb (130.636 kg) IBW/kg (Calculated) : 75.3  Vital Signs: Temp: 99.2 F (37.3 C) (09/11 0219) Temp src: Oral (09/11 0219) BP: 88/58 mmHg (09/11 0658) Pulse Rate: 79 (09/11 0658)  Labs:  Recent Labs  02/01/13 0410  HGB 11.8*  HCT 34.6*  PLT 194  LABPROT 15.0  INR 1.21  CREATININE 1.04    Estimated Creatinine Clearance: 100.2 ml/min (by C-G formula based on Cr of 1.04).   Assessment: 33 yoM s/p bilateral TKA 9/10 with indwelling epidural catheter. MD ordered to have Coumadin started for VTE prophylaxis 9/11. Coumadin score = 6.   INR 1.21, CBC okay. Plan discontinuation of epidural 9/12. Will dose Coumadin conservatively while still has indwelling epidural catheter.  Goal of Therapy:  INR 2-3 Monitor platelets by anticoagulation protocol: Yes   Plan:   Coumadin 4mg  po x 1 tonight  Daily PT/INR  Coumadin book/video  Coumadin education when able  Geoffry Paradise, PharmD, BCPS Pager: [phone] 8:20 AM Pharmacy #: [id number]

## 2013-02-01 NOTE — Plan of Care (Signed)
Problem: Consults Goal: Diagnosis- Total Joint Replacement Outcome: Completed/Met Date Met:  02/01/13 Primary Total Knee Bilateral

## 2013-02-01 NOTE — Progress Notes (Signed)
Subjective: 1 Day Post-Op Procedure(s) (LRB): TOTAL KNEE BILATERAL (Bilateral) Patient reports pain as moderate pain last night.  Anesthesia adjusted the epidural for better control and also gave bolus medication. Feeling better this morning.  Feeling a little dizzy with the low pressure.  BP down following epidural bolus.  Gave fluids. Patient seen in rounds with Dr. [name].  Wife in room at bedside. Patient is well, but has had some minor complaints of pain in the knee, requiring pain medications We will start therapy today.  Plan is to go CIR versus possible SNF after hospital stay.  Objective: Vital signs in last 24 hours: Temp:  [97.4 F (36.3 C)-99.2 F (37.3 C)] 97.7 F (36.5 C) (09/11 0800) Pulse Rate:  [63-102] 76 (09/11 0800) Resp:  [10-18] 18 (09/11 0800) BP: (88-128)/(52-87) 99/52 mmHg (09/11 0800) SpO2:  [92 %-100 %] 96 % (09/11 0800) Weight:  [130.636 kg (288 lb)] 130.636 kg (288 lb) (09/10 1708)  Intake/Output from previous day:  Intake/Output Summary (Last 24 hours) at 02/01/13 0827 Last data filed at 02/01/13 1610  Gross per 24 hour  Intake 3078.33 ml  Output   2490 ml  Net 588.33 ml    Intake/Output this shift: UOP aboput 400 cc since MN +588  Labs:  Recent Labs  02/01/13 0410  HGB 11.8*    Recent Labs  02/01/13 0410  WBC 13.6*  RBC 4.05*  HCT 34.6*  PLT 194    Recent Labs  02/01/13 0410  NA 131*  K 4.2  CL 100  CO2 25  BUN 15  CREATININE 1.04  GLUCOSE 153*  CALCIUM 8.4    Recent Labs  02/01/13 0410  INR 1.21    EXAM General - Patient is Alert, Appropriate and Oriented Extremity - Neurovascular intact Sensation intact distally Dorsiflexion/Plantar flexion intact Dressing - dressing C/D/I Motor Function - intact, moving feet and toes well on exam.  Both Hemovacs pulled without difficulty.  Past Medical History  Diagnosis Date  . Restless leg syndrome   . Arthritis     Assessment/Plan: 1 Day Post-Op  Procedure(s) (LRB): TOTAL KNEE BILATERAL (Bilateral) Principal Problem:   OA (osteoarthritis) of knee Active Problems:   Postoperative anemia due to acute blood loss   Hyponatremia   Postop Hypotension  Estimated body mass index is 40.19 kg/(m^2) as calculated from the following:   Height as of this encounter: 5\' 11"  (1.803 m).   Weight as of this encounter: 130.636 kg (288 lb). Advance diet Up with therapy Continue foley due to strict I&O and urinary output monitoring Continue foley for now.  Will keep foley until tomorrow and will not be removed until at least 6-8 hours following the removal of the epidural catheter. Gave fluids for the low blood pressure.  DVT Prophylaxis - Lovenox and Coumadin, Lovenox will not start until tomorrow afternoon following removal of the epidural. First dose of Coumadin this evening. Weight-Bearing as tolerated to both leg  No vaccines.  Continue O2 and Pulse OX   Take Coumadin for four weeks and then discontinue.  The dose may need to be adjusted based upon the INR.  Please follow the INR and titrate Coumadin dose for a therapeutic range between 2.0 and 3.0 INR.   Lovenox injections will start tomorrow evening after the epidural has been removed and continue until the INR is therapeutic at or greater than 2.0.  When INR reaches the therapeutic level of equal to or greater than 2.0, the patient may discontinue the Lovenox  injections.  PERKINS, ALEXZANDREW 02/01/2013, 8:27 AM

## 2013-02-01 NOTE — Progress Notes (Signed)
Clinical Social Work Department BRIEF PSYCHOSOCIAL ASSESSMENT 02/01/2013  Patient:  [name] [name], [name] [name]     Account Number:  [id number]     Admit date:  01/31/2013  Clinical Social Worker:  Candie Chroman  Date/Time:  02/01/2013 01:00 PM  Referred by:  Physician  Date Referred:  02/01/2013 Referred for  SNF Placement   Other Referral:   CIR vs SNF   Interview type:  Family Other interview type:    PSYCHOSOCIAL DATA Living Status:  WIFE Admitted from facility:   Level of care:   Primary support name:  Marisa Cyphers Primary support relationship to patient:  SPOUSE Degree of support available:   supportive    CURRENT CONCERNS Current Concerns  Post-Acute Placement   Other Concerns:    SOCIAL WORK ASSESSMENT / PLAN Pt is a 62 yr old gentleman living at home prior to hospitalization. CSW met with pt / spouse to assist with d/c planning. Pt hopes to have rehab at [facility] following hospital d/c. If CIR is unable to assist pt would like to have rehab at [facility]. SNF has been contacted and has offerred SNF bed pending BCBS approval. CSW will follow to assist with d/c planning.   Assessment/plan status:  Psychosocial Support/Ongoing Assessment of Needs Other assessment/ plan:   CIR vs SNF   Information/referral to community resources:   Insurance coverage for SNF reviewed.    PATIENT'S/FAMILY'S RESPONSE TO PLAN OF CARE: Pt sleeping during most of conversation. Spouse is hopful that CIR will be able to assist with rehab.   Cori Razor LCSW [phone]

## 2013-02-01 NOTE — Progress Notes (Signed)
I was called at 0600 by RN because Mr. Benney was experiencing worsening left knee pain. Right knee comfortable. I arrived at 0620 and sensory levels were T-10 on right and lower than L-4 on left. Catheter and dressing in place. Non-red. Non-tender. Moving both legs.  I pulled catheter back about 1.5 cm and dosed epidural with 7 cc Lidocaine 1.5% with epi 1:200000 (MPF). Bobby RN informed and BP protocol started. Increased infusion rate to 18 cc/hour.   After 20 minutes, left knee pain has decreased from 8/10 to 2/10. BP 100/60.  Patient reminded to call us for worsening pain. INR 1.21. Epidural to be discontinued 9/12.

## 2013-02-01 NOTE — Evaluation (Signed)
Physical Therapy Evaluation Patient Details Name: [name] [name] MRN: [id number] DOB: 11-Jun-1950 Today's Date: 02/01/2013 Time: 4098-1191 PT Time Calculation (min): 58 min  PT Assessment / Plan / Recommendation History of Present Illness     Clinical Impression  Pt s/p Bil TKR presents with decreased Bil LE strength/ROM and post op pain limiting functional mobility.  Pt would greatly benefit from follow up rehab at CIR level prior to return home with family.   PT Assessment  Patient needs continued PT services    Follow Up Recommendations  CIR    Does the patient have the potential to tolerate intense rehabilitation      Barriers to Discharge        Equipment Recommendations  None recommended by PT    Recommendations for Other Services OT consult   Frequency 7X/week    Precautions / Restrictions Precautions Precautions: Fall;Knee Required Braces or Orthoses: Knee Immobilizer - Right;Knee Immobilizer - Left Knee Immobilizer - Right: Discontinue once straight leg raise with < 10 degree lag Knee Immobilizer - Left: Discontinue once straight leg raise with < 10 degree lag Restrictions Weight Bearing Restrictions: No Other Position/Activity Restrictions: WBAT   Pertinent Vitals/Pain 5/10; premed, cold packs provided      Mobility  Bed Mobility Bed Mobility: Supine to Sit;Sit to Supine Supine to Sit: 1: +2 Total assist;HOB flat Supine to Sit: Patient Percentage: 50% Sit to Supine: 1: +2 Total assist Sit to Supine: Patient Percentage: 50% Details for Bed Mobility Assistance: cues for sequence and assist to manage Bil LEs and to bring trunk upright and back to supine Transfers Transfers: Sit to Stand;Stand to Sit Sit to Stand: 1: +2 Total assist;With upper extremity assist;From elevated surface;From bed Sit to Stand: Patient Percentage: 50% Stand to Sit: 1: +2 Total assist;To elevated surface;To bed;With upper extremity assist Stand to Sit: Patient Percentage:  50% Details for Transfer Assistance: cues and assist for LE management, cues for use of UEs to self assist; physical assist to bring wt fwd and up over LEs and to control descent Ambulation/Gait Ambulation/Gait Assistance: 1: +2 Total assist Ambulation/Gait: Patient Percentage: 60% Ambulation Distance (Feet): 13 Feet Assistive device: Rolling walker Ambulation/Gait Assistance Details: cues for sequence, posture and position from RW; physical assist for support, balance and RW management Gait Pattern: Step-to pattern;Decreased step length - right;Decreased step length - left;Shuffle;Antalgic;Trunk flexed    Exercises Total Joint Exercises Ankle Circles/Pumps: AROM;Both;10 reps;Supine Quad Sets: AROM;Both;10 reps;Supine Heel Slides: AAROM;Both;10 reps;Supine Straight Leg Raises: AAROM;Both;10 reps;Supine   PT Diagnosis: Difficulty walking  PT Problem List: Decreased strength;Decreased range of motion;Decreased activity tolerance;Decreased balance;Decreased mobility;Decreased knowledge of use of DME;Obesity;Pain PT Treatment Interventions: DME instruction;Gait training;Functional mobility training;Therapeutic activities;Therapeutic exercise;Patient/family education     PT Goals(Current goals can be found in the care plan section) Acute Rehab PT Goals Patient Stated Goal: Resume previous lifestyle with decreased pain PT Goal Formulation: With patient Time For Goal Achievement: 02/08/13 Potential to Achieve Goals: Good  Visit Information  Last PT Received On: 02/01/13 Assistance Needed: +2       Prior Functioning  Home Living Family/patient expects to be discharged to:: Inpatient rehab Living Arrangements: Spouse/significant other Prior Function Level of Independence: Independent Communication Communication: No difficulties    Cognition  Cognition Arousal/Alertness: Awake/alert Behavior During Therapy: WFL for tasks assessed/performed Overall Cognitive Status: Within  Functional Limits for tasks assessed    Extremity/Trunk Assessment Upper Extremity Assessment Upper Extremity Assessment: Overall WFL for tasks assessed Lower Extremity Assessment Lower Extremity Assessment: RLE  deficits/detail;LLE deficits/detail RLE Deficits / Details: 2/5 quads with AAROM -10 - 45 and limited proprioception 2* epidural LLE Deficits / Details: 3-/5 quads with AAROM at knee -10 - 50    Balance    End of Session PT - End of Session Equipment Utilized During Treatment: Gait belt;Right knee immobilizer;Left knee immobilizer Activity Tolerance: Patient tolerated treatment well Patient left: in bed;with call bell/phone within reach;with nursing/sitter in room;with family/visitor present Nurse Communication: Mobility status CPM Left Knee CPM Left Knee: On  GP     Iyauna Sing 02/01/2013, 4:43 PM

## 2013-02-01 NOTE — Progress Notes (Signed)
Rehab Admissions Coordinator Note:  Patient was screened by Trish Mage for appropriateness for an Inpatient Acute Rehab Consult.  At this time, an inpatient rehab consult has been ordered and completed.  I will follow up for possible admission.  Trish Mage 02/01/2013, 4:51 PM  I can be reached at [phone].

## 2013-02-01 NOTE — Progress Notes (Signed)
Utilization review completed.  

## 2013-02-02 LAB — BASIC METABOLIC PANEL
CO2: 25 mEq/L (ref 19–32)
GFR calc non Af Amer: 87 mL/min — ABNORMAL LOW (ref >90)
Glucose, Bld: 142 mg/dL — ABNORMAL HIGH (ref 70–99)
Potassium: 4.2 mEq/L (ref 3.5–5.1)
Sodium: 135 mEq/L (ref 135–145)

## 2013-02-02 LAB — CBC
Hemoglobin: 10.7 g/dL — ABNORMAL LOW (ref 13.0–17.0)
Platelets: 183 10*3/uL (ref 150–400)
RBC: 3.61 MIL/uL — ABNORMAL LOW (ref 4.22–5.81)
WBC: 12.9 10*3/uL — ABNORMAL HIGH (ref 4.0–10.5)

## 2013-02-02 LAB — PROTIME-INR
INR: 1.22 (ref 0.00–1.49)
Prothrombin Time: 15.1 seconds (ref 11.6–15.2)

## 2013-02-02 MED ORDER — WARFARIN SODIUM 7.5 MG PO TABS
7.5000 mg | ORAL_TABLET | Freq: Once | ORAL | Status: AC
  Administered 2013-02-02: 18:00:00 7.5 mg via ORAL
  Filled 2013-02-02: qty 1

## 2013-02-02 MED ORDER — ENOXAPARIN SODIUM 30 MG/0.3ML ~~LOC~~ SOLN
30.0000 mg | Freq: Two times a day (BID) | SUBCUTANEOUS | Status: DC
  Administered 2013-02-02 – 2013-02-05 (×6): 30 mg via SUBCUTANEOUS
  Filled 2013-02-02 (×7): qty 0.3

## 2013-02-02 NOTE — Care Management Note (Addendum)
    Page 1 of 1   02/05/2013     1:57:22 PM   CARE MANAGEMENT NOTE 02/05/2013  Patient:  [name] [name], [name] [name]   Account Number:  [id number]  Date Initiated:  02/02/2013  Documentation initiated by:  Colleen Can  Subjective/Objective Assessment:   DX BILATERAL KNEE REPLACEMNT     Action/Plan:   CIR VS SNF   Anticipated DC Date:  02/04/2013   Anticipated DC Plan:  IP REHAB FACILITY  In-house referral  Clinical Social Worker      DC Associate Professor  CM consult      PAC Choice  IP REHAB   Choice offered to / List presented to:             Status of service:  Completed, signed off Medicare Important Message given?   (If response is "NO", the following Medicare IM given date fields will be blank) Date Medicare IM given:   Date Additional Medicare IM given:    Discharge Disposition:  IP REHAB FACILITY  Per UR Regulation:  Reviewed for med. necessity/level of care/duration of stay  If discussed at Long Length of Stay Meetings, dates discussed:    Comments:

## 2013-02-02 NOTE — Progress Notes (Signed)
Physical Therapy Treatment Patient Details Name: [name] [name] MRN: [id number] DOB: 04-May-1951 Today's Date: 02/02/2013 Time: 7829-5621 PT Time Calculation (min): 33 min  PT Assessment / Plan / Recommendation  History of Present Illness Pt with B TKRs   PT Comments     Follow Up Recommendations  CIR     Does the patient have the potential to tolerate intense rehabilitation     Barriers to Discharge        Equipment Recommendations  None recommended by PT    Recommendations for Other Services OT consult  Frequency 7X/week   Progress towards PT Goals Progress towards PT goals: Progressing toward goals  Plan Current plan remains appropriate    Precautions / Restrictions Precautions Precautions: Fall;Knee Required Braces or Orthoses: Knee Immobilizer - Right;Knee Immobilizer - Left Knee Immobilizer - Right: Discontinue once straight leg raise with < 10 degree lag Knee Immobilizer - Left: Discontinue once straight leg raise with < 10 degree lag Restrictions Weight Bearing Restrictions: No Other Position/Activity Restrictions: WBAT   Pertinent Vitals/Pain    Mobility  Bed Mobility Bed Mobility: Not assessed Supine to Sit: 1: +2 Total assist;HOB flat Supine to Sit: Patient Percentage: 60% Sit to Supine: 1: +2 Total assist Sit to Supine: Patient Percentage: 50% Details for Bed Mobility Assistance: Pt up in recliner upon entering room Transfers Transfers: Sit to Stand;Stand to Sit Sit to Stand: 1: +2 Total assist;With upper extremity assist;From chair/3-in-1 Sit to Stand: Patient Percentage: 50% Stand to Sit: 1: +2 Total assist;With upper extremity assist;To chair/3-in-1 Stand to Sit: Patient Percentage: 50% Details for Transfer Assistance: cues for correct hand placement and  control of descent Ambulation/Gait Ambulation/Gait Assistance: 1: +2 Total assist Ambulation/Gait: Patient Percentage: 70% Ambulation Distance (Feet): 58 Feet (and 12) Assistive device: Rolling  walker Ambulation/Gait Assistance Details: cues for posture, sequence, stride length and position from RW Gait Pattern: Step-to pattern;Decreased step length - right;Decreased step length - left;Shuffle;Antalgic;Trunk flexed    Exercises     PT Diagnosis:    PT Problem List:   PT Treatment Interventions:     PT Goals (current goals can now be found in the care plan section) Acute Rehab PT Goals Patient Stated Goal: Resume previous lifestyle with decreased pain PT Goal Formulation: With patient Time For Goal Achievement: 02/08/13 Potential to Achieve Goals: Good  Visit Information  Last PT Received On: 02/02/13 Assistance Needed: +2 History of Present Illness: Pt with B TKRs    Subjective Data  Patient Stated Goal: Resume previous lifestyle with decreased pain   Cognition  Cognition Arousal/Alertness: Awake/alert Behavior During Therapy: WFL for tasks assessed/performed Overall Cognitive Status: Within Functional Limits for tasks assessed    Balance  Balance Balance Assessed: No  End of Session PT - End of Session Equipment Utilized During Treatment: Gait belt;Right knee immobilizer;Left knee immobilizer Activity Tolerance: Patient tolerated treatment well Patient left: in bed;with call bell/phone within reach;with family/visitor present Nurse Communication: Mobility status CPM Left Knee CPM Left Knee: Off   GP     [name] [name] 02/02/2013, 4:46 PM

## 2013-02-02 NOTE — Progress Notes (Signed)
Physical Therapy Treatment Patient Details Name: [name] [name] MRN: [id number] DOB: 24-Dec-1950 Today's Date: 02/02/2013 Time: 7829-5621 PT Time Calculation (min): 24 min  PT Assessment / Plan / Recommendation  History of Present Illness     PT Comments   OOB deferred to after epidural d/c  Follow Up Recommendations  CIR     Does the patient have the potential to tolerate intense rehabilitation     Barriers to Discharge        Equipment Recommendations  None recommended by PT    Recommendations for Other Services OT consult  Frequency 7X/week   Progress towards PT Goals Progress towards PT goals: Progressing toward goals  Plan Current plan remains appropriate    Precautions / Restrictions Precautions Precautions: Fall;Knee Required Braces or Orthoses: Knee Immobilizer - Right;Knee Immobilizer - Left Knee Immobilizer - Right: Discontinue once straight leg raise with < 10 degree lag Knee Immobilizer - Left: Discontinue once straight leg raise with < 10 degree lag Restrictions Weight Bearing Restrictions: No Other Position/Activity Restrictions: WBAT   Pertinent Vitals/Pain 4-5/10; premed, epidural in place, cold pack provided    Mobility  Bed Mobility Bed Mobility:  (OOB deferred to after epidural d/c)    Exercises Total Joint Exercises Ankle Circles/Pumps: AROM;Both;10 reps;Supine Quad Sets: AROM;Both;Supine;15 reps Heel Slides: AAROM;Both;Supine;20 reps Straight Leg Raises: AAROM;Both;Supine;20 reps Goniometric ROM: R knee AAROM -10 - 80; L knee AAROM  -10 - 65   PT Diagnosis:    PT Problem List:   PT Treatment Interventions:     PT Goals (current goals can now be found in the care plan section) Acute Rehab PT Goals Patient Stated Goal: Resume previous lifestyle with decreased pain PT Goal Formulation: With patient Time For Goal Achievement: 02/08/13 Potential to Achieve Goals: Good  Visit Information  Last PT Received On: 02/02/13 Assistance Needed:  +2    Subjective Data  Patient Stated Goal: Resume previous lifestyle with decreased pain   Cognition  Cognition Arousal/Alertness: Awake/alert Behavior During Therapy: WFL for tasks assessed/performed Overall Cognitive Status: Within Functional Limits for tasks assessed    Balance     End of Session CPM Left Knee CPM Left Knee: Off CPM Right Knee CPM Right Knee: Off   GP     [name] [name] 02/02/2013, 12:15 PM

## 2013-02-02 NOTE — Progress Notes (Signed)
Physical Therapy Treatment Patient Details Name: [name] [name] MRN: [id number] DOB: [birth year] Today's Date: 02/02/2013 Time: 4098-1191 PT Time Calculation (min): 24 min  PT Assessment / Plan / Recommendation  History of Present Illness Pt with B TKRs   PT Comments     Follow Up Recommendations  CIR     Does the patient have the potential to tolerate intense rehabilitation     Barriers to Discharge        Equipment Recommendations  None recommended by PT    Recommendations for Other Services OT consult  Frequency 7X/week   Progress towards PT Goals Progress towards PT goals: Progressing toward goals  Plan Current plan remains appropriate    Precautions / Restrictions Precautions Precautions: Fall;Knee Required Braces or Orthoses: Knee Immobilizer - Right;Knee Immobilizer - Left Knee Immobilizer - Right: Discontinue once straight leg raise with < 10 degree lag Knee Immobilizer - Left: Discontinue once straight leg raise with < 10 degree lag Restrictions Weight Bearing Restrictions: No Other Position/Activity Restrictions: WBAT   Pertinent Vitals/Pain 5/10; premed, cold packs provided    Mobility  Bed Mobility Bed Mobility: Not assessed Supine to Sit: 1: +2 Total assist;HOB flat Supine to Sit: Patient Percentage: 60% Details for Bed Mobility Assistance: Pt up in recliner upon entering room Transfers Transfers: Sit to Stand;Stand to Sit Sit to Stand: 1: +2 Total assist;With upper extremity assist;From chair/3-in-1 Sit to Stand: Patient Percentage: 50% Stand to Sit: 1: +2 Total assist;With upper extremity assist;To chair/3-in-1 Stand to Sit: Patient Percentage: 50% Details for Transfer Assistance: cues for correct hand placement and  control of descent Ambulation/Gait Ambulation/Gait Assistance: 1: +2 Total assist Ambulation/Gait: Patient Percentage: 70% Ambulation Distance (Feet): 75 Feet Assistive device: Rolling walker Ambulation/Gait Assistance Details: cues  for posture, stride length and position from RW Gait Pattern: Step-to pattern;Decreased step length - right;Decreased step length - left;Shuffle;Antalgic;Trunk flexed    Exercises     PT Diagnosis:    PT Problem List:   PT Treatment Interventions:     PT Goals (current goals can now be found in the care plan section) Acute Rehab PT Goals Patient Stated Goal: Resume previous lifestyle with decreased pain PT Goal Formulation: With patient Time For Goal Achievement: 02/08/13 Potential to Achieve Goals: Good  Visit Information  Last PT Received On: 02/02/13 Assistance Needed: +2 History of Present Illness: Pt with B TKRs    Subjective Data  Patient Stated Goal: Resume previous lifestyle with decreased pain   Cognition  Cognition Arousal/Alertness: Awake/alert Behavior During Therapy: WFL for tasks assessed/performed Overall Cognitive Status: Within Functional Limits for tasks assessed    Balance  Balance Balance Assessed: No  End of Session PT - End of Session Equipment Utilized During Treatment: Gait belt;Right knee immobilizer;Left knee immobilizer Activity Tolerance: Patient tolerated treatment well Patient left: in chair;with call bell/phone within reach;with family/visitor present Nurse Communication: Mobility status CPM Left Knee CPM Left Knee: Off   GP     Sibel Khurana 02/02/2013, 4:40 PM

## 2013-02-02 NOTE — Progress Notes (Signed)
Rehab admissions - Evaluated for possible admission.  I have talked briefly to patient and he would like inpatient rehab admission.  I have called [employer] and faxed initial request for inpatient rehab.  I will follow up Monday am with patient and update [employer] on Monday morning.  Call me for questions.  #[phone]

## 2013-02-02 NOTE — Progress Notes (Signed)
Epidural pulled out at 11:15 am per request of service.  Tip intact.  Site looks good.  No complications.  Ronelle Nigh MD

## 2013-02-02 NOTE — Progress Notes (Signed)
ANTICOAGULATION CONSULT NOTE - Follow Up  Pharmacy Consult for Coumadin Indication: VTE prophylaxis  Allergies  Allergen Reactions  . Penicillins     REACTION: swelling   Labs:  Recent Labs  02/01/13 0410 02/02/13 0415  HGB 11.8* 10.7*  HCT 34.6* 31.2*  PLT 194 183  LABPROT 15.0 15.1  INR 1.21 1.22  CREATININE 1.04 0.94    Assessment: 61 yoM s/p bilateral TKA 9/10 with indwelling epidural catheter. MD ordered to have Coumadin started for VTE prophylaxis 9/11. Coumadin score = 6.   INR 1.22, CBC okay. Epidural to come out today, Lovenox to start 12 hours after epidural removal. Plan Lovenox until INR is tx, Coumadin x 4 weeks.   Goal of Therapy:  INR 2-3 Monitor platelets by anticoagulation protocol: Yes   Plan:   Coumadin 7.5mg  po x 1 tonight (after epidural removal)  Daily PT/INR  Coumadin education today  [name] [name], PharmD, BCPS Pager: [phone] 10:55 AM Pharmacy #: [id number]

## 2013-02-02 NOTE — Progress Notes (Signed)
Subjective: 2 Days Post-Op Procedure(s) (LRB): TOTAL KNEE BILATERAL (Bilateral) Patient reports pain as mild and moderate.   Patient seen in rounds with Dr. [name]. Patient is well, but has had some minor complaints of pain in the left knee, requiring pain medications Epidural to come out today.  Anticipate possible increase in pain temporarily after the epidural has been removed. Plan is to go CIR versus SNF after hospital stay.  Objective: Vital signs in last 24 hours: Temp:  [97.6 F (36.4 C)-99.6 F (37.6 C)] 98.8 F (37.1 C) (09/12 0852) Pulse Rate:  [69-102] 98 (09/12 0852) Resp:  [14-20] 15 (09/12 0852) BP: (95-126)/(57-82) 109/82 mmHg (09/12 0852) SpO2:  [93 %-99 %] 93 % (09/12 0852)  Intake/Output from previous day:  Intake/Output Summary (Last 24 hours) at 02/02/13 0854 Last data filed at 02/02/13 0700  Gross per 24 hour  Intake 2453.33 ml  Output   5100 ml  Net -2646.67 ml    Intake/Output this shift: UOP 1175 since MN  Labs:  Recent Labs  02/01/13 0410 02/02/13 0415  HGB 11.8* 10.7*    Recent Labs  02/01/13 0410 02/02/13 0415  WBC 13.6* 12.9*  RBC 4.05* 3.61*  HCT 34.6* 31.2*  PLT 194 183    Recent Labs  02/01/13 0410 02/02/13 0415  NA 131* 135  K 4.2 4.2  CL 100 105  BUN 15 14  CREATININE 1.04 0.94  GLUCOSE 153* 142*  CALCIUM 8.4 8.5    Recent Labs  02/01/13 0410 02/02/13 0415  INR 1.21 1.22    EXAM General - Patient is Alert, Appropriate and Oriented Extremity - Neurovascular intact Sensation intact distally Dorsiflexion/Plantar flexion intact Dressing/Incision - clean, dry, no drainage, healing to both incisions Motor Function - intact, moving feet and toes well on exam. Able to do SLR's on the left leg but not on the right. Epidural appears to be more concentrated on the right side.   Past Medical History  Diagnosis Date  . Restless leg syndrome   . Arthritis     Assessment/Plan: 2 Days Post-Op Procedure(s)  (LRB): TOTAL KNEE BILATERAL (Bilateral) Principal Problem:   OA (osteoarthritis) of knee Active Problems:   Postoperative anemia due to acute blood loss   Hyponatremia   Postop Hypotension  Estimated body mass index is 40.19 kg/(m^2) as calculated from the following:   Height as of this encounter: 5\' 11"  (1.803 m).   Weight as of this encounter: 130.636 kg (288 lb). Up with therapy Continue foley due to urinary output monitoring and she still has her epidural in place.  Will remove the catheter six hours after the epidural is removed.  Will need to note in chart when the epidural is pulled.  DVT Prophylaxis - Lovenox and Coumadin, Lovenox will not start until later this afternoon though after the epidural has been removed for twelve hours.  Will need to note in chart when the epidural is pulled. Anticipate possible increase in pain temporarily after the epidural has been removed.  Weight-Bearing as tolerated to both legs  Take Coumadin for four weeks and then discontinue.  The dose may need to be adjusted based upon the INR.  Please follow the INR and titrate Coumadin dose for a therapeutic range between 2.0 and 3.0 INR.  Lovenox injections will start this evening after the epidural has been removed and continue until the INR is therapeutic at or greater than 2.0.  When INR reaches the therapeutic level of equal to or greater than 2.0,  the patient may discontinue the Lovenox injections.  PERKINS, ALEXZANDREW 02/02/2013, 8:54 AM

## 2013-02-02 NOTE — Evaluation (Signed)
Occupational Therapy Evaluation Patient Details Name: [name] [name] MRN: [id number] DOB: Aug 01, 1950 Today's Date: 02/02/2013 Time: 4098-1191 OT Time Calculation (min): 21 min  OT Assessment / Plan / Recommendation History of present illness Pt with B TKRs   Clinical Impression   Pt demos decline in function with ADLs and ADL mobility safety and would benefit from acute OT services to increase level of function and safety. Pt plans to d/c to CIR for further rehab after acute care d/c. Pt requires extensive A with LB ADLs and ADL mobility    OT Assessment  Patient needs continued OT Services    Follow Up Recommendations  CIR    Barriers to Discharge Decreased caregiver support pt plans to d/c to CIR  Equipment Recommendations  None recommended by OT;Other (comment) (TBD at CIR level of care)    Recommendations for Other Services    Frequency  Min 2X/week    Precautions / Restrictions Precautions Precautions: Fall;Knee Required Braces or Orthoses: Knee Immobilizer - Right;Knee Immobilizer - Left Knee Immobilizer - Right: Discontinue once straight leg raise with < 10 degree lag Knee Immobilizer - Left: Discontinue once straight leg raise with < 10 degree lag Restrictions Weight Bearing Restrictions: No Other Position/Activity Restrictions: WBAT   Pertinent Vitals/Pain 4/10    ADL  Grooming: Performed;Wash/dry hands;Wash/dry face;Set up Where Assessed - Grooming: Supported sitting Upper Body Bathing: Simulated;Supervision/safety;Set up Where Assessed - Upper Body Bathing: Supported sitting Lower Body Bathing: Simulated;+1 Total assistance Upper Body Dressing: Performed;Supervision/safety;Set up Lower Body Dressing: +1 Total assistance Toilet Transfer: Performed;+2 Total assistance Toilet Transfer Method: Sit to stand Toilet Transfer Equipment: Bedside commode Toileting - Clothing Manipulation and Hygiene: +1 Total assistance Where Assessed - Toileting Clothing  Manipulation and Hygiene: Standing Tub/Shower Transfer Method: Not assessed Equipment Used: Long-handled shoe horn;Long-handled sponge;Reacher;Rolling walker;Knee Immobilizer;Sock aid;Other (comment) (BSC) Transfers/Ambulation Related to ADLs: cues for safety, correct hand placement, LE positioning ADL Comments: Pt ptovided with educaton and demo of ADL A/E and education and picture provided of tub bench    OT Diagnosis: Acute pain;Generalized weakness  OT Problem List: Decreased knowledge of use of DME or AE;Impaired balance (sitting and/or standing);Pain;Decreased activity tolerance OT Treatment Interventions: Self-care/ADL training;Therapeutic exercise;Patient/family education;Neuromuscular education;Balance training;Therapeutic activities;DME and/or AE instruction   OT Goals(Current goals can be found in the care plan section) Acute Rehab OT Goals Patient Stated Goal: Resume previous lifestyle with decreased pain OT Goal Formulation: With patient Time For Goal Achievement: 02/09/13 Potential to Achieve Goals: Good ADL Goals Pt Will Perform Grooming: with mod assist;with min assist;standing;with caregiver independent in assisting Pt Will Perform Lower Body Bathing: with max assist;with caregiver independent in assisting;with adaptive equipment Pt Will Perform Lower Body Dressing: with max assist;with caregiver independent in assisting;with adaptive equipment Pt Will Transfer to Toilet: with max assist;with total assist;bedside commode;ambulating Pt Will Perform Toileting - Clothing Manipulation and hygiene: with max assist;sit to/from stand;sitting/lateral leans  Visit Information  Last OT Received On: 02/02/13 Assistance Needed: +2 PT/OT Co-Evaluation/Treatment: Yes History of Present Illness: Pt with B TKRs       Prior Functioning     Home Living Family/patient expects to be discharged to:: Inpatient rehab Living Arrangements: Spouse/significant other Prior Function Level  of Independence: Independent Communication Communication: No difficulties Dominant Hand: Right         Vision/Perception Vision - History Baseline Vision: Wears glasses only for reading Patient Visual Report: No change from baseline Perception Perception: Within Functional Limits   Cognition  Cognition Arousal/Alertness: Awake/alert Behavior During  Therapy: WFL for tasks assessed/performed Overall Cognitive Status: Within Functional Limits for tasks assessed    Extremity/Trunk Assessment Upper Extremity Assessment Upper Extremity Assessment: Overall WFL for tasks assessed     Mobility Bed Mobility Bed Mobility: Not assessed Details for Bed Mobility Assistance: Pt up in recliner upon entering room Transfers Transfers: Sit to Stand;Stand to Sit Sit to Stand: 1: +2 Total assist;With upper extremity assist;From chair/3-in-1 Stand to Sit: 1: +2 Total assist;With upper extremity assist;To chair/3-in-1 Details for Transfer Assistance: cues for correct hand placement and  control of descent     Exercise     Balance Balance Balance Assessed: No   End of Session OT - End of Session Equipment Utilized During Treatment: Rolling walker;Other (comment) (BSC, ADL A/E) Activity Tolerance: Patient tolerated treatment well Patient left: Other (comment) (ambulating with PT) CPM Left Knee CPM Left Knee: Off  GO     Margaretmary Eddy [facility] 02/02/2013, 3:39 PM

## 2013-02-03 LAB — PROTIME-INR
INR: 1.08 (ref 0.00–1.49)
Prothrombin Time: 13.8 seconds (ref 11.6–15.2)

## 2013-02-03 LAB — CBC
HCT: 29.5 % — ABNORMAL LOW (ref 39.0–52.0)
Hemoglobin: 10.1 g/dL — ABNORMAL LOW (ref 13.0–17.0)
RDW: 14.6 % (ref 11.5–15.5)
WBC: 11.7 10*3/uL — ABNORMAL HIGH (ref 4.0–10.5)

## 2013-02-03 MED ORDER — WARFARIN SODIUM 7.5 MG PO TABS
7.5000 mg | ORAL_TABLET | Freq: Once | ORAL | Status: AC
  Administered 2013-02-03: 19:00:00 7.5 mg via ORAL
  Filled 2013-02-03: qty 1

## 2013-02-03 NOTE — Progress Notes (Signed)
Physical Therapy Treatment Patient Details Name: [name] [name] MRN: [id number] DOB: 11/11/1950 Today's Date: 02/03/2013 Time: 1132-1220 PT Time Calculation (min): 48 min  PT Assessment / Plan / Recommendation  History of Present Illness     PT Comments     Follow Up Recommendations  CIR     Does the patient have the potential to tolerate intense rehabilitation     Barriers to Discharge        Equipment Recommendations  None recommended by PT    Recommendations for Other Services OT consult  Frequency 7X/week   Progress towards PT Goals Progress towards PT goals: Progressing toward goals  Plan Current plan remains appropriate    Precautions / Restrictions Precautions Precautions: Fall;Knee Required Braces or Orthoses: Knee Immobilizer - Right Knee Immobilizer - Right: Discontinue once straight leg raise with < 10 degree lag Knee Immobilizer - Left: Discontinue once straight leg raise with < 10 degree lag Restrictions Weight Bearing Restrictions: No Other Position/Activity Restrictions: WBAT   Pertinent Vitals/Pain 4/10; premed, cold packs provided    Mobility  Bed Mobility Bed Mobility: Supine to Sit Supine to Sit: 3: Mod assist Details for Bed Mobility Assistance: cues for sequence and assist for LEs Transfers Transfers: Sit to Stand;Stand to Sit Sit to Stand: 1: +2 Total assist;With upper extremity assist;From bed;From elevated surface Sit to Stand: Patient Percentage: 70% Stand to Sit: 1: +2 Total assist;With upper extremity assist;To chair/3-in-1 Stand to Sit: Patient Percentage: 70% Details for Transfer Assistance: cues for correct hand placement, to walk legs fwd/back and to control of descent Ambulation/Gait Ambulation/Gait Assistance: 1: +2 Total assist Ambulation/Gait: Patient Percentage: 80% Ambulation Distance (Feet): 98 Feet Assistive device: Rolling walker Ambulation/Gait Assistance Details: cues for posture, position from RW, stride length and  pace Gait Pattern: Step-to pattern;Decreased step length - right;Decreased step length - left;Shuffle;Antalgic;Trunk flexed Stairs: No    Exercises Total Joint Exercises Ankle Circles/Pumps: AROM;Both;Supine;20 reps Quad Sets: AROM;Both;Supine;20 reps Heel Slides: AAROM;Both;Supine;20 reps Straight Leg Raises: AAROM;Both;Supine;20 reps;AROM   PT Diagnosis:    PT Problem List:   PT Treatment Interventions:     PT Goals (current goals can now be found in the care plan section) Acute Rehab PT Goals Patient Stated Goal: Resume previous lifestyle with decreased pain PT Goal Formulation: With patient Time For Goal Achievement: 02/08/13 Potential to Achieve Goals: Good  Visit Information  Last PT Received On: 02/03/13 Assistance Needed: +2    Subjective Data  Patient Stated Goal: Resume previous lifestyle with decreased pain   Cognition  Cognition Arousal/Alertness: Awake/alert Behavior During Therapy: WFL for tasks assessed/performed Overall Cognitive Status: Within Functional Limits for tasks assessed    Balance     End of Session PT - End of Session Equipment Utilized During Treatment: Gait belt;Left knee immobilizer Activity Tolerance: Patient tolerated treatment well Patient left: in chair;with call bell/phone within reach;with family/visitor present Nurse Communication: Mobility status   GP     [name] [name] 02/03/2013, 12:40 PM

## 2013-02-03 NOTE — Progress Notes (Signed)
Subjective: Doing well.  Pain controlled.     Objective: Vital signs in last 24 hours: Temp:  [98.3 F (36.8 C)-98.6 F (37 C)] 98.6 F (37 C) (09/13 0515) Pulse Rate:  [85-94] 85 (09/13 0515) Resp:  [16-17] 16 (09/13 0515) BP: (112-127)/(63-65) 112/65 mmHg (09/13 0515) SpO2:  [94 %-96 %] 94 % (09/13 0515)  Intake/Output from previous day: 09/12 0701 - 09/13 0700 In: 840 [P.O.:840] Out: 1400 [Urine:1400] Intake/Output this shift:     Recent Labs  02/01/13 0410 02/02/13 0415 02/03/13 0434  HGB 11.8* 10.7* 10.1*    Recent Labs  02/02/13 0415 02/03/13 0434  WBC 12.9* 11.7*  RBC 3.61* 3.40*  HCT 31.2* 29.5*  PLT 183 171    Recent Labs  02/01/13 0410 02/02/13 0415  NA 131* 135  K 4.2 4.2  CL 100 105  CO2 25 25  BUN 15 14  CREATININE 1.04 0.94  GLUCOSE 153* 142*  CALCIUM 8.4 8.5    Recent Labs  02/02/13 0415 02/03/13 0434  INR 1.22 1.08    Exam:  bilat knee wounds looks good.  steris intact.  No drainage or signs of infection.  Neurologically intact.    Assessment/Plan: Awaiting cir vs snf placement.  Anticipate transfer Monday.  Continue present care.  RN to do dressing change with abd's and bilat thigh hi ted hose.     [name] [name] M 02/03/2013, 8:58 AM

## 2013-02-03 NOTE — Progress Notes (Signed)
Physical Therapy Treatment Patient Details Name: [name] [name] MRN: [id number] DOB: Jan 19, 1951 Today's Date: 02/03/2013 Time: 4098-1191 PT Time Calculation (min): 26 min  PT Assessment / Plan / Recommendation  History of Present Illness     PT Comments     Follow Up Recommendations  CIR     Does the patient have the potential to tolerate intense rehabilitation     Barriers to Discharge        Equipment Recommendations  None recommended by PT    Recommendations for Other Services OT consult  Frequency 7X/week   Progress towards PT Goals Progress towards PT goals: Progressing toward goals  Plan Current plan remains appropriate    Precautions / Restrictions Precautions Precautions: Fall;Knee Required Braces or Orthoses: Knee Immobilizer - Right Knee Immobilizer - Right: Discontinue once straight leg raise with < 10 degree lag Knee Immobilizer - Left: Discontinue once straight leg raise with < 10 degree lag Restrictions Weight Bearing Restrictions: No Other Position/Activity Restrictions: WBAT   Pertinent Vitals/Pain 5/10; premed, ice pack provided    Mobility  Bed Mobility Bed Mobility: Sit to Supine Sit to Supine: 3: Mod assist Details for Bed Mobility Assistance: cues for sequence and assist for LEs Transfers Transfers: Sit to Stand;Stand to Sit Sit to Stand: 3: Mod assist Stand to Sit: 3: Mod assist Details for Transfer Assistance: cues for correct hand placement, to walk legs fwd/back and to control of descent Ambulation/Gait Ambulation/Gait Assistance: 4: Min assist;3: Mod assist Ambulation Distance (Feet): 96 Feet Assistive device: Rolling walker Ambulation/Gait Assistance Details: min cues for stride length, posture, position from RW  Gait Pattern: Step-to pattern;Decreased step length - right;Decreased step length - left;Shuffle;Antalgic;Trunk flexed Stairs: No    Exercises     PT Diagnosis:    PT Problem List:   PT Treatment Interventions:     PT  Goals (current goals can now be found in the care plan section) Acute Rehab PT Goals Patient Stated Goal: Resume previous lifestyle with decreased pain PT Goal Formulation: With patient Time For Goal Achievement: 02/08/13 Potential to Achieve Goals: Good  Visit Information  Last PT Received On: 02/03/13 Assistance Needed: +2    Subjective Data  Patient Stated Goal: Resume previous lifestyle with decreased pain   Cognition  Cognition Arousal/Alertness: Awake/alert Behavior During Therapy: WFL for tasks assessed/performed Overall Cognitive Status: Within Functional Limits for tasks assessed    Balance     End of Session PT - End of Session Equipment Utilized During Treatment: Gait belt;Left knee immobilizer Activity Tolerance: Patient tolerated treatment well Patient left: in bed;with call bell/phone within reach Nurse Communication: Mobility status   GP     Valoria Tamburri 02/03/2013, 4:59 PM

## 2013-02-03 NOTE — Progress Notes (Signed)
ANTICOAGULATION CONSULT NOTE - Follow Up  Pharmacy Consult for Coumadin Indication: VTE prophylaxis  Allergies  Allergen Reactions  . Penicillins     REACTION: swelling   Labs:  Recent Labs  02/01/13 0410 02/02/13 0415 02/03/13 0434  HGB 11.8* 10.7* 10.1*  HCT 34.6* 31.2* 29.5*  PLT 194 183 171  LABPROT 15.0 15.1 13.8  INR 1.21 1.22 1.08  CREATININE 1.04 0.94  --     Assessment: 61 yoM s/p bilateral TKA 9/10 with indwelling epidural catheter. MD ordered to have Coumadin started for VTE prophylaxis 9/11. Coumadin score = 6.   INR 1.08 - decreased from 1.22  CBC stable  Epidural removed 9/12 11:15am, Lovenox to start 12 hours after epidural removal.   Plan Lovenox until INR is tx, Coumadin x 4 weeks.   Goal of Therapy:  INR 2-3 Monitor platelets by anticoagulation protocol: Yes   Plan:   Coumadin 7.5mg  po x 1 tonight (after epidural removal) - will not increase dose tonight d/t recent epidural and surgery and given 7.5mg  x 1 and may not have seen full effects of this dose yet.   Daily PT/INR  Juliette Alcide, PharmD, BCPS.   Pager: [phone] 02/03/2013 10:12 AM

## 2013-02-04 LAB — PROTIME-INR: Prothrombin Time: 14.2 seconds (ref 11.6–15.2)

## 2013-02-04 MED ORDER — WARFARIN SODIUM 10 MG PO TABS
10.0000 mg | ORAL_TABLET | Freq: Once | ORAL | Status: AC
  Administered 2013-02-04: 10 mg via ORAL
  Filled 2013-02-04: qty 1

## 2013-02-04 NOTE — Progress Notes (Signed)
Physical Therapy Treatment Patient Details Name: [name] [name] MRN: [id number] DOB: 05-Apr-1951 Today's Date: 02/04/2013 Time: 9562-1308 PT Time Calculation (min): 40 min  PT Assessment / Plan / Recommendation  History of Present Illness Pt with B TKRs   PT Comments     Follow Up Recommendations  CIR     Does the patient have the potential to tolerate intense rehabilitation     Barriers to Discharge        Equipment Recommendations  None recommended by PT    Recommendations for Other Services OT consult  Frequency 7X/week   Progress towards PT Goals Progress towards PT goals: Progressing toward goals  Plan Current plan remains appropriate    Precautions / Restrictions Precautions Precautions: Fall;Knee Required Braces or Orthoses:  (None--both KI off today) Knee Immobilizer - Right: Discontinue once straight leg raise with < 10 degree lag Knee Immobilizer - Left: Discontinue once straight leg raise with < 10 degree lag Restrictions Weight Bearing Restrictions: No Other Position/Activity Restrictions: WBAT   Pertinent Vitals/Pain 5/10; premed, cold packs provided    Mobility  Bed Mobility Bed Mobility: Supine to Sit;Sitting - Scoot to Edge of Bed Supine to Sit: 4: Min assist;HOB elevated;With rails (for RLE) Sitting - Scoot to Edge of Bed: 4: Min assist (for RLE) Details for Bed Mobility Assistance: cues for sequence and assist for LEs Transfers Transfers: Sit to Stand;Stand to Sit Sit to Stand: 1: +2 Total assist;From elevated surface;With upper extremity assist;From bed Sit to Stand: Patient Percentage: 80% Stand to Sit: 1: +2 Total assist;With upper extremity assist;With armrests;To chair/3-in-1 Stand to Sit: Patient Percentage: 80% Details for Transfer Assistance: Cues to "walk" legs out as he sat down Ambulation/Gait Ambulation/Gait Assistance: 4: Min assist;3: Mod assist Ambulation Distance (Feet): 96 Feet Assistive device: Rolling walker Ambulation/Gait  Assistance Details: cues for stride length and position from RW Gait Pattern: Step-to pattern;Decreased step length - right;Decreased step length - left;Shuffle;Antalgic;Trunk flexed    Exercises Total Joint Exercises Ankle Circles/Pumps: AROM;Both;Supine;20 reps Quad Sets: AROM;Both;Supine;20 reps Heel Slides: AAROM;Both;Supine;20 reps Straight Leg Raises: AAROM;Both;Supine;20 reps;AROM   PT Diagnosis:    PT Problem List:   PT Treatment Interventions:     PT Goals (current goals can now be found in the care plan section) Acute Rehab PT Goals Patient Stated Goal: Resume previous lifestyle with decreased pain PT Goal Formulation: With patient Time For Goal Achievement: 02/08/13 Potential to Achieve Goals: Good  Visit Information  Last PT Received On: 02/04/13 Assistance Needed: +2 History of Present Illness: Pt with B TKRs    Subjective Data  Patient Stated Goal: Resume previous lifestyle with decreased pain   Cognition  Cognition Arousal/Alertness: Awake/alert Behavior During Therapy: WFL for tasks assessed/performed Overall Cognitive Status: Within Functional Limits for tasks assessed    Balance     End of Session PT - End of Session Equipment Utilized During Treatment: Gait belt Activity Tolerance: Patient tolerated treatment well Patient left: in chair;with call bell/phone within reach;with family/visitor present Nurse Communication: Mobility status   GP     [name] [name] 02/04/2013, 1:15 PM

## 2013-02-04 NOTE — Progress Notes (Signed)
   Subjective: 4 Days Post-Op Procedure(s) (LRB): TOTAL KNEE BILATERAL (Bilateral) Patient reports pain as mild.   Patient seen in rounds with Dr. [name].  Wife in room.  Working with therapy.  Walked 98 feet yesterday. Patient is well, and has had no acute complaints or problems Plan is to go CIR versus SNF after hospital stay.  Objective: Vital signs in last 24 hours: Temp:  [98.8 F (37.1 C)-99.3 F (37.4 C)] 99.3 F (37.4 C) (09/14 0521) Pulse Rate:  [97-102] 97 (09/14 0521) Resp:  [16] 16 (09/14 0521) BP: (114-128)/(71-81) 114/71 mmHg (09/14 0521) SpO2:  [93 %-98 %] 98 % (09/14 0521)  Intake/Output from previous day:  Intake/Output Summary (Last 24 hours) at 02/04/13 0732 Last data filed at 02/04/13 0521  Gross per 24 hour  Intake   1320 ml  Output   1220 ml  Net    100 ml    Labs:  Recent Labs  02/02/13 0415 02/03/13 0434  HGB 10.7* 10.1*    Recent Labs  02/02/13 0415 02/03/13 0434  WBC 12.9* 11.7*  RBC 3.61* 3.40*  HCT 31.2* 29.5*  PLT 183 171    Recent Labs  02/02/13 0415  NA 135  K 4.2  CL 105  BUN 14  CREATININE 0.94  GLUCOSE 142*  CALCIUM 8.5    Recent Labs  02/02/13 0415 02/03/13 0434 02/04/13 0505  INR 1.22 1.08 1.12    EXAM General - Patient is Alert, Appropriate and Oriented Extremity - Neurovascular intact Sensation intact distally Dorsiflexion/Plantar flexion intact Dressing/Incision - clean, dry, no drainage, healing to both incisions Motor Function - intact, moving feet and toes well on exam.    Past Medical History  Diagnosis Date  . Restless leg syndrome   . Arthritis     Assessment/Plan: 4 Days Post-Op Procedure(s) (LRB): TOTAL KNEE BILATERAL (Bilateral) Principal Problem:   OA (osteoarthritis) of knee Active Problems:   Postoperative anemia due to acute blood loss   Hyponatremia   Postop Hypotension  Estimated body mass index is 40.19 kg/(m^2) as calculated from the following:   Height as of this  encounter: 5\' 11"  (1.803 m).   Weight as of this encounter: 130.636 kg (288 lb). Up with therapy  DVT Prophylaxis - Lovenox and Coumadin, Lovenox will not start until later this afternoon though after the epidural has been removed for twelve hours.  Will need to note in chart when the epidural is pulled.   Weight-Bearing as tolerated to both legs  Take Coumadin for four weeks and then discontinue.  The dose may need to be adjusted based upon the INR.  Please follow the INR and titrate Coumadin dose for a therapeutic range between 2.0 and 3.0 INR.   Lovenox injections. When INR reaches the therapeutic level of equal to or greater than 2.0, the patient may discontinue the Lovenox injections.  [name], [name] 02/04/2013, 7:32 AM

## 2013-02-04 NOTE — Progress Notes (Signed)
Physical Therapy Treatment Patient Details Name: [name] [name] MRN: [id number] DOB: 05/16/1951 Today's Date: 02/04/2013 Time: 8295-6213 PT Time Calculation (min): 25 min  PT Assessment / Plan / Recommendation  History of Present Illness Pt with B TKRs   PT Comments     Follow Up Recommendations  CIR     Does the patient have the potential to tolerate intense rehabilitation     Barriers to Discharge        Equipment Recommendations  None recommended by PT    Recommendations for Other Services OT consult  Frequency 7X/week   Progress towards PT Goals Progress towards PT goals: Progressing toward goals  Plan Current plan remains appropriate    Precautions / Restrictions Precautions Precautions: Fall;Knee Required Braces or Orthoses: Knee Immobilizer - Right Knee Immobilizer - Right: Discontinue once straight leg raise with < 10 degree lag Knee Immobilizer - Left: Discontinue once straight leg raise with < 10 degree lag Restrictions Weight Bearing Restrictions: No Other Position/Activity Restrictions: WBAT   Pertinent Vitals/Pain 4/10; premed    Mobility  Bed Mobility Bed Mobility: Sit to Supine Sit to Supine: 3: Mod assist Details for Bed Mobility Assistance: cues for sequence and assist for LEs Transfers Transfers: Sit to Stand;Stand to Sit Sit to Stand: 1: +2 Total assist;From elevated surface;With upper extremity assist;With armrests;From chair/3-in-1 Sit to Stand: Patient Percentage: 80% Stand to Sit: 1: +2 Total assist;With upper extremity assist;With armrests;To bed Stand to Sit: Patient Percentage: 80% Details for Transfer Assistance: Cues to "walk" legs out as he sat down Ambulation/Gait Ambulation/Gait Assistance: 4: Min assist;3: Mod assist Ambulation Distance (Feet): 98 Feet Assistive device: Rolling walker Ambulation/Gait Assistance Details: cues for posture, stride length and position from RW Gait Pattern: Step-to pattern;Decreased step length -  right;Decreased step length - left;Shuffle;Trunk flexed Stairs: No    Exercises     PT Diagnosis:    PT Problem List:   PT Treatment Interventions:     PT Goals (current goals can now be found in the care plan section) Acute Rehab PT Goals Patient Stated Goal: Resume previous lifestyle with decreased pain PT Goal Formulation: With patient Time For Goal Achievement: 02/08/13 Potential to Achieve Goals: Good  Visit Information  Last PT Received On: 02/04/13 Assistance Needed: +2 History of Present Illness: Pt with B TKRs    Subjective Data  Patient Stated Goal: Resume previous lifestyle with decreased pain   Cognition  Cognition Arousal/Alertness: Awake/alert Behavior During Therapy: WFL for tasks assessed/performed Overall Cognitive Status: Within Functional Limits for tasks assessed    Balance     End of Session PT - End of Session Equipment Utilized During Treatment: Gait belt Activity Tolerance: Patient tolerated treatment well Patient left: in bed;with call bell/phone within reach Nurse Communication: Mobility status   GP     [name] [name] 02/04/2013, 4:35 PM

## 2013-02-04 NOTE — Progress Notes (Addendum)
ANTICOAGULATION CONSULT NOTE - Follow Up  Pharmacy Consult for Coumadin Indication: VTE prophylaxis  Allergies  Allergen Reactions  . Penicillins     REACTION: swelling   Labs:  Recent Labs  02/02/13 0415 02/03/13 0434 02/04/13 0505  HGB 10.7* 10.1*  --   HCT 31.2* 29.5*  --   PLT 183 171  --   LABPROT 15.1 13.8 14.2  INR 1.22 1.08 1.12  CREATININE 0.94  --   --     Assessment: 42 yoM s/p bilateral TKA 9/10 with indwelling epidural catheter. MD ordered to have Coumadin started for VTE prophylaxis 9/11. Coumadin score = 6.   INR 1.12 - no real change  CBC stable 9/13  Epidural removed 9/12 11:15am, Lovenox to start 12 hours after epidural removal.   Plan Lovenox until INR is tx, Coumadin x 4 weeks.   Goal of Therapy:  INR 2-3 Monitor platelets by anticoagulation protocol: Yes   Plan:   Increase Coumadin 10mg  po x 1 tonight as given 7.5mg  po x 2 without change in INR.   Daily PT/INR  Stop LMWH when INR > 2  Juliette Alcide, PharmD, BCPS.   Pager: [phone] 02/04/2013 10:33 AM

## 2013-02-04 NOTE — Progress Notes (Signed)
Occupational Therapy Treatment Patient Details Name: ZAKARY KIMURA MRN: [id number] DOB: 1950/10/11 Today's Date: 02/04/2013 Time: 4098-1191 OT Time Calculation (min): 25 min  OT Assessment / Plan / Recommendation  History of present illness Pt with B TKRs   OT comments  Pt making progress, able to ambulate today without need for Bil KI's.  Follow Up Recommendations  CIR       Equipment Recommendations  3 in 1 bedside comode;Tub/shower bench       Frequency Min 2X/week   Progress towards OT Goals Progress towards OT goals: Progressing toward goals  Plan Discharge plan remains appropriate    Precautions / Restrictions Precautions Precautions: Fall;Knee Required Braces or Orthoses:  (None--both KI off today) Restrictions Other Position/Activity Restrictions: WBAT       ADL  Toilet Transfer: +2 Total assistance Toilet Transfer: Patient Percentage: 80% Toilet Transfer Method: Sit to stand Toilet Transfer Equipment:  (Bed (raised)>out door/down hall>sit in recliner) Equipment Used: Rolling walker Transfers/Ambulation Related to ADLs: total A +2 (pt=80%) sit<>stand and ambulation; cues to "walk"legs out as he sits down      OT Goals(current goals can now be found in the care plan section)    Visit Information  Last OT Received On: 02/04/13 Assistance Needed: +2 PT/OT Co-Evaluation/Treatment: Yes History of Present Illness: Pt with B TKRs          Cognition  Cognition Arousal/Alertness: Awake/alert Behavior During Therapy: WFL for tasks assessed/performed Overall Cognitive Status: Within Functional Limits for tasks assessed    Mobility  Bed Mobility Bed Mobility: Supine to Sit;Sitting - Scoot to Edge of Bed Supine to Sit: 4: Min assist;HOB elevated;With rails (for RLE) Sitting - Scoot to Edge of Bed: 4: Min assist (for RLE) Transfers Transfers: Sit to Stand;Stand to Sit Sit to Stand: 1: +2 Total assist;From elevated surface;With upper extremity assist;From  bed Sit to Stand: Patient Percentage: 80% Stand to Sit: 1: +2 Total assist;With upper extremity assist;With armrests;To chair/3-in-1 Stand to Sit: Patient Percentage: 80% Details for Transfer Assistance: Cues to "walk" legs out as he sat down          End of Session OT - End of Session Equipment Utilized During Treatment: Rolling walker Activity Tolerance: Patient tolerated treatment well Patient left: in chair;with call bell/phone within reach;with family/visitor present       Evette Georges [phone] 02/04/2013, 10:45 AM

## 2013-02-05 ENCOUNTER — Encounter (HOSPITAL_COMMUNITY): Payer: Self-pay | Admitting: Physical Medicine and Rehabilitation

## 2013-02-05 ENCOUNTER — Inpatient Hospital Stay (HOSPITAL_COMMUNITY)
Admission: RE | Admit: 2013-02-05 | Discharge: 2013-02-09 | DRG: 462 | Disposition: A | Discharge status: Home Health | Payer: BC Managed Care – PPO | Source: Intra-hospital | Attending: Physical Medicine & Rehabilitation | Admitting: Physical Medicine & Rehabilitation

## 2013-02-05 DIAGNOSIS — M1710 Unilateral primary osteoarthritis, unspecified knee: Secondary | ICD-10-CM

## 2013-02-05 DIAGNOSIS — G2581 Restless legs syndrome: Secondary | ICD-10-CM | POA: Diagnosis present

## 2013-02-05 DIAGNOSIS — D62 Acute posthemorrhagic anemia: Secondary | ICD-10-CM | POA: Diagnosis present

## 2013-02-05 DIAGNOSIS — T4275XA Adverse effect of unspecified antiepileptic and sedative-hypnotic drugs, initial encounter: Secondary | ICD-10-CM | POA: Diagnosis not present

## 2013-02-05 DIAGNOSIS — Z5189 Encounter for other specified aftercare: Principal | ICD-10-CM

## 2013-02-05 DIAGNOSIS — K5900 Constipation, unspecified: Secondary | ICD-10-CM | POA: Diagnosis present

## 2013-02-05 DIAGNOSIS — Z96659 Presence of unspecified artificial knee joint: Secondary | ICD-10-CM

## 2013-02-05 DIAGNOSIS — D72829 Elevated white blood cell count, unspecified: Secondary | ICD-10-CM | POA: Diagnosis present

## 2013-02-05 DIAGNOSIS — M179 Osteoarthritis of knee, unspecified: Secondary | ICD-10-CM | POA: Diagnosis present

## 2013-02-05 DIAGNOSIS — M1990 Unspecified osteoarthritis, unspecified site: Secondary | ICD-10-CM | POA: Diagnosis present

## 2013-02-05 DIAGNOSIS — R3911 Hesitancy of micturition: Secondary | ICD-10-CM | POA: Diagnosis present

## 2013-02-05 LAB — PROTIME-INR
INR: 1.15 (ref 0.00–1.49)
Prothrombin Time: 14.5 seconds (ref 11.6–15.2)

## 2013-02-05 LAB — URINALYSIS, ROUTINE W REFLEX MICROSCOPIC
Bilirubin Urine: NEGATIVE
Hgb urine dipstick: NEGATIVE
Ketones, ur: NEGATIVE mg/dL
Nitrite: NEGATIVE
Protein, ur: NEGATIVE mg/dL
Specific Gravity, Urine: 1.012 (ref 1.005–1.030)
Urobilinogen, UA: 1 mg/dL (ref 0.0–1.0)

## 2013-02-05 MED ORDER — OXYCODONE HCL ER 10 MG PO T12A
10.0000 mg | EXTENDED_RELEASE_TABLET | Freq: Two times a day (BID) | ORAL | Status: DC
  Administered 2013-02-05 – 2013-02-08 (×6): 10 mg via ORAL
  Filled 2013-02-05 (×6): qty 1

## 2013-02-05 MED ORDER — WARFARIN SODIUM 10 MG PO TABS
10.0000 mg | ORAL_TABLET | Freq: Once | ORAL | Status: DC
  Filled 2013-02-05: qty 1

## 2013-02-05 MED ORDER — ALUM & MAG HYDROXIDE-SIMETH 200-200-20 MG/5ML PO SUSP: 30.0000 mL | ORAL | Status: DC | PRN

## 2013-02-05 MED ORDER — WARFARIN SODIUM 7.5 MG PO TABS: 7.5000 mg | ORAL_TABLET | Freq: Every day | ORAL | Status: DC

## 2013-02-05 MED ORDER — FLEET ENEMA 7-19 GM/118ML RE ENEM
1.0000 | ENEMA | Freq: Once | RECTAL | Status: AC
  Administered 2013-02-07: 1 via RECTAL
  Filled 2013-02-05: qty 1

## 2013-02-05 MED ORDER — GUAIFENESIN-DM 100-10 MG/5ML PO SYRP: 5.0000 mL | ORAL_SOLUTION | Freq: Four times a day (QID) | ORAL | Status: DC | PRN

## 2013-02-05 MED ORDER — PROCHLORPERAZINE EDISYLATE 5 MG/ML IJ SOLN
5.0000 mg | Freq: Four times a day (QID) | INTRAMUSCULAR | Status: DC | PRN
  Filled 2013-02-05: qty 2

## 2013-02-05 MED ORDER — WARFARIN SODIUM 10 MG PO TABS
10.0000 mg | ORAL_TABLET | Freq: Once | ORAL | Status: AC
  Administered 2013-02-05: 10 mg via ORAL
  Filled 2013-02-05: qty 1

## 2013-02-05 MED ORDER — FLEET ENEMA 7-19 GM/118ML RE ENEM: 1.0000 | ENEMA | Freq: Once | RECTAL | Status: AC | PRN

## 2013-02-05 MED ORDER — BISACODYL 10 MG RE SUPP: 10.0000 mg | Freq: Every day | RECTAL | Status: DC | PRN

## 2013-02-05 MED ORDER — METHOCARBAMOL 500 MG PO TABS: 500.0000 mg | ORAL_TABLET | Freq: Four times a day (QID) | ORAL | Status: DC | PRN

## 2013-02-05 MED ORDER — TRAZODONE HCL 50 MG PO TABS: 25.0000 mg | ORAL_TABLET | Freq: Every evening | ORAL | Status: DC | PRN

## 2013-02-05 MED ORDER — METHOCARBAMOL 500 MG PO TABS
500.0000 mg | ORAL_TABLET | Freq: Four times a day (QID) | ORAL | Status: DC
  Administered 2013-02-05 – 2013-02-09 (×16): 500 mg via ORAL
  Filled 2013-02-05 (×18): qty 1

## 2013-02-05 MED ORDER — DSS 100 MG PO CAPS: 100.0000 mg | ORAL_CAPSULE | Freq: Two times a day (BID) | ORAL | Status: DC

## 2013-02-05 MED ORDER — PROCHLORPERAZINE 25 MG RE SUPP
12.5000 mg | Freq: Four times a day (QID) | RECTAL | Status: DC | PRN
  Filled 2013-02-05: qty 1

## 2013-02-05 MED ORDER — ENOXAPARIN SODIUM 30 MG/0.3ML ~~LOC~~ SOLN: 30.0000 mg | Freq: Two times a day (BID) | SUBCUTANEOUS | Status: DC

## 2013-02-05 MED ORDER — OXYCODONE HCL 5 MG PO TABS
10.0000 mg | ORAL_TABLET | ORAL | Status: DC | PRN
  Administered 2013-02-05: 15 mg via ORAL
  Administered 2013-02-07: 10 mg via ORAL
  Filled 2013-02-05: qty 4
  Filled 2013-02-05: qty 3

## 2013-02-05 MED ORDER — BISACODYL 10 MG RE SUPP: 10.0000 mg | Freq: Every day | RECTAL | Status: AC | PRN

## 2013-02-05 MED ORDER — ACETAMINOPHEN 325 MG PO TABS: 325.0000 mg | ORAL_TABLET | ORAL | Status: DC | PRN

## 2013-02-05 MED ORDER — WARFARIN - PHARMACIST DOSING INPATIENT: Freq: Every day | Status: DC

## 2013-02-05 MED ORDER — TRAMADOL HCL 50 MG PO TABS: 50.0000 mg | ORAL_TABLET | Freq: Four times a day (QID) | ORAL | Status: DC | PRN

## 2013-02-05 MED ORDER — METOCLOPRAMIDE HCL 5 MG PO TABS: 5.0000 mg | ORAL_TABLET | Freq: Three times a day (TID) | ORAL | Status: DC | PRN

## 2013-02-05 MED ORDER — POLYETHYLENE GLYCOL 3350 17 G PO PACK: 17.0000 g | PACK | Freq: Every day | ORAL | Status: DC | PRN

## 2013-02-05 MED ORDER — METHOCARBAMOL 500 MG PO TABS
500.0000 mg | ORAL_TABLET | Freq: Four times a day (QID) | ORAL | Status: DC | PRN
  Filled 2013-02-05: qty 1

## 2013-02-05 MED ORDER — SORBITOL 70 % SOLN
45.0000 mL | Freq: Once | Status: AC
  Administered 2013-02-05: 45 mL via ORAL
  Filled 2013-02-05 (×2): qty 60

## 2013-02-05 MED ORDER — DIPHENHYDRAMINE HCL 12.5 MG/5ML PO ELIX
12.5000 mg | ORAL_SOLUTION | Freq: Four times a day (QID) | ORAL | Status: DC | PRN
  Filled 2013-02-05: qty 10

## 2013-02-05 MED ORDER — ONDANSETRON HCL 4 MG PO TABS: 4.0000 mg | ORAL_TABLET | Freq: Four times a day (QID) | ORAL | Status: DC | PRN

## 2013-02-05 MED ORDER — SENNOSIDES-DOCUSATE SODIUM 8.6-50 MG PO TABS
1.0000 | ORAL_TABLET | Freq: Two times a day (BID) | ORAL | Status: DC
  Administered 2013-02-05 – 2013-02-07 (×4): 1 via ORAL
  Filled 2013-02-05 (×6): qty 1

## 2013-02-05 MED ORDER — OXYCODONE HCL 5 MG PO TABS: 5.0000 mg | ORAL_TABLET | ORAL | Status: DC | PRN

## 2013-02-05 MED ORDER — PROCHLORPERAZINE MALEATE 5 MG PO TABS
5.0000 mg | ORAL_TABLET | Freq: Four times a day (QID) | ORAL | Status: DC | PRN
  Filled 2013-02-05: qty 2

## 2013-02-05 MED ORDER — ENOXAPARIN SODIUM 30 MG/0.3ML ~~LOC~~ SOLN
30.0000 mg | Freq: Two times a day (BID) | SUBCUTANEOUS | Status: DC
  Administered 2013-02-05 – 2013-02-09 (×8): 30 mg via SUBCUTANEOUS
  Filled 2013-02-05 (×9): qty 0.3

## 2013-02-05 MED ORDER — BISACODYL 10 MG RE SUPP
10.0000 mg | Freq: Once | RECTAL | Status: AC
  Administered 2013-02-05: 10:00:00 10 mg via RECTAL
  Filled 2013-02-05: qty 1

## 2013-02-05 NOTE — Progress Notes (Signed)
   Subjective: 5 Days Post-Op Procedure(s) (LRB): TOTAL KNEE BILATERAL (Bilateral) Patient reports pain as mild.   Patient seen in rounds by Dr. [name]. Patient is well, and has had no acute complaints or problems Patient is ready to go to rehab center today.  Objective: Vital signs in last 24 hours: Temp:  [98.1 F (36.7 C)-98.6 F (37 C)] 98.1 F (36.7 C) (09/15 0500) Pulse Rate:  [86-100] 86 (09/15 0500) Resp:  [16-18] 16 (09/15 0500) BP: (120-140)/(67-83) 120/71 mmHg (09/15 0500) SpO2:  [94 %-100 %] 94 % (09/15 0500)  Intake/Output from previous day:  Intake/Output Summary (Last 24 hours) at 02/05/13 0709 Last data filed at 02/05/13 0500  Gross per 24 hour  Intake    360 ml  Output   1100 ml  Net   -740 ml    Intake/Output this shift:    Labs:  Recent Labs  02/03/13 0434  HGB 10.1*    Recent Labs  02/03/13 0434  WBC 11.7*  RBC 3.40*  HCT 29.5*  PLT 171   No results found for this basename: NA, K, CL, CO2, BUN, CREATININE, GLUCOSE, CALCIUM,  in the last 72 hours  Recent Labs  02/04/13 0505 02/05/13 0415  INR 1.12 1.15    EXAM: General - Patient is Alert, Appropriate and Oriented Extremity - Neurovascular intact Sensation intact distally Dorsiflexion/Plantar flexion intact Incision - clean, dry, no drainage, healing to both incisions Motor Function - intact, moving feet and toes well on exam.   Assessment/Plan: 5 Days Post-Op Procedure(s) (LRB): TOTAL KNEE BILATERAL (Bilateral) Procedure(s) (LRB): TOTAL KNEE BILATERAL (Bilateral) Past Medical History  Diagnosis Date  . Restless leg syndrome   . Arthritis    Principal Problem:   OA (osteoarthritis) of knee Active Problems:   Postoperative anemia due to acute blood loss   Hyponatremia   Postop Hypotension  Estimated body mass index is 40.19 kg/(m^2) as calculated from the following:   Height as of this encounter: 5\' 11"  (1.803 m).   Weight as of this encounter: 130.636 kg (288  lb). Up with therapy Discharge to SNF or CIR Diet - Regular diet Follow up - in 2 weeks from surgery Activity - WBAT to both legs Disposition - CIR versus SNF Condition Upon Discharge - Good D/C Meds - See DC Summary DVT Prophylaxis - Lovenox  [name] [name] 02/05/2013, 7:09 AM

## 2013-02-05 NOTE — H&P (Signed)
Physical Medicine and Rehabilitation Admission H&P  CC: Endstage DJD bilateral knees.  HPI: [name] [name] is a 62 y.o. male with history of RLS, endstage OA bilateral knees who elected to undergo B-TKR on 01/31/13 by Dr. [name]. Post op WBAT and on coumadin/lovenox bridge for DVT prophylaxis. He did have hypotension after epidural bolus that responded well to fluid resuscitation. ABLA noted with hgb at 10.1. On epidural for pain management. PT/OT initiated and CIR recommended by therapy team.  The patient was ultimately admitted today.  Review of Systems  Eyes: Negative for blurred vision and double vision.  Respiratory: Negative for cough, shortness of breath and wheezing.  Cardiovascular: Negative for chest pain and palpitations.  Gastrointestinal: Positive for nausea and constipation. Some results today with laxative. Negative for abdominal pain.  Genitourinary: Positive for frequency.  Hesitancy since surgery  Musculoskeletal: Positive for joint pain.  Neurological: Negative for dizziness and headaches.  Psychiatric/Behavioral: The patient has insomnia.   Past Medical History   Diagnosis  Date   .  Restless leg syndrome    .  Arthritis     Past Surgical History   Procedure  Laterality  Date   .  Right leg surgery       torn ligament and cartilage in 1972   .  Torn retina       1995   .  Cataract srugery       bilateral   .  Left leg surgery     .  Cholecystectomy     .  Total knee arthroplasty  Bilateral  01/31/2013     Procedure: TOTAL KNEE BILATERAL; Surgeon: Loanne Drilling, MD; Location: [facility]; Service: Orthopedics; Laterality: Bilateral;    Family History   Problem  Relation  Age of Onset   .  Stroke  Father     Social History: Married. Works full time as an Airline pilot. He reports that he has never smoked. He has never used smokeless tobacco. He reports that drinks has an alcoholic drink every 3-4 weeks. He reports that he does not use illicit drugs.  Allergies    Allergen  Reactions   .  Penicillins      REACTION: swelling    No prescriptions prior to admission    Home:  Home Living  Family/patient expects to be discharged to:: Inpatient rehab  Living Arrangements: Spouse/significant other  Functional History:   Functional Status:  Mobility:  Bed Mobility  Bed Mobility: Sit to Supine  Supine to Sit: 4: Min assist;HOB elevated;With rails (for RLE)  Supine to Sit: Patient Percentage: 60%  Sitting - Scoot to Edge of Bed: 4: Min assist (for RLE)  Sit to Supine: 3: Mod assist  Sit to Supine: Patient Percentage: 50%  Transfers  Transfers: Sit to Stand;Stand to Sit  Sit to Stand: 1: +2 Total assist;From elevated surface;With upper extremity assist;With armrests;From chair/3-in-1  Sit to Stand: Patient Percentage: 80%  Stand to Sit: 1: +2 Total assist;With upper extremity assist;With armrests;To bed  Stand to Sit: Patient Percentage: 80%  Ambulation/Gait  Ambulation/Gait Assistance: 4: Min assist;3: Mod assist  Ambulation/Gait: Patient Percentage: 80%  Ambulation Distance (Feet): 98 Feet  Assistive device: Rolling walker  Ambulation/Gait Assistance Details: cues for posture, stride length and position from RW  Gait Pattern: Step-to pattern;Decreased step length - right;Decreased step length - left;Shuffle;Trunk flexed  Stairs: No   ADL:  ADL  Grooming: Performed;Wash/dry hands;Wash/dry face;Set up  Where Assessed - Grooming: Supported sitting  Upper Body  Bathing: Simulated;Supervision/safety;Set up  Where Assessed - Upper Body Bathing: Supported sitting  Lower Body Bathing: Simulated;+1 Total assistance  Upper Body Dressing: Performed;Supervision/safety;Set up  Lower Body Dressing: +1 Total assistance  Toilet Transfer: +2 Total assistance  Toilet Transfer Method: Sit to stand  Toilet Transfer Equipment: (Bed (raised)>out door/down hall>sit in recliner)  Tub/Shower Transfer Method: Not assessed  Equipment Used: Rolling walker   Transfers/Ambulation Related to ADLs: total A +2 (pt=80%) sit<>stand and ambulation; cues to "walk"legs out as he sits down  ADL Comments: Pt ptovided with educaton and demo of ADL A/E and education and picture provided of tub bench  Cognition:  Cognition  Overall Cognitive Status: Within Functional Limits for tasks assessed  Orientation Level: Oriented X4  Cognition  Arousal/Alertness: Awake/alert  Behavior During Therapy: WFL for tasks assessed/performed  Overall Cognitive Status: Within Functional Limits for tasks assessed    Physical Exam:  Blood pressure 120/71, pulse 86, temperature 98.1 F (36.7 C), temperature source Oral, resp. rate 18, height 5\' 11"  (1.803 m), weight 130.636 kg (288 lb), SpO2 94.00%.  Physical Exam  Nursing note and vitals reviewed.  Constitutional: He is oriented to person, place, and time. He appears well-developed and well-nourished. obese HENT:  Head: Normocephalic and atraumatic.  Eyes: Conjunctivae and EOM are normal. Pupils are equal, round, and reactive to light.  Neck: Normal range of motion. Neck supple.  Cardiovascular: Normal rate and regular rhythm.  No murmur heard.  Pulmonary/Chest: Effort normal and breath sounds normal. No respiratory distress. He has no wheezes.  Abdominal: Soft. Bowel sounds are normal. He exhibits distension. There is no tenderness. There is no rebound.  Musculoskeletal:  Bilateral knee incisions clean, dry and intact. scattered ecchymosis noted from mid-thigh to posterior fossa. 1+ edema proximal RLE>LLE-->2+ pedally.  Neurological: He is alert and oriented to person, place, and time.  Bilateral HF 2-, KE not tested in bed. ADF/APF 4-/5, no sensory deficits Motor 5/5 in BUE FROM BUE  Skin: Skin is warm and dry.  Psoriatic patch on left knee.   Results for orders placed during the hospital encounter of 01/31/13 (from the past 48 hour(s))   PROTIME-INR Status: None    Collection Time    02/04/13 5:05 AM   Result   Value  Range    Prothrombin Time  14.2  11.6 - 15.2 seconds    INR  1.12  0.00 - 1.49   PROTIME-INR Status: None    Collection Time    02/05/13 4:15 AM   Result  Value  Range    Prothrombin Time  14.5  11.6 - 15.2 seconds    INR  1.15  0.00 - 1.49    No results found.  Post Admission Physician Evaluation:  1. Functional deficits secondary to OA of the bilateral knees s/p bilateral TKA's. 2. Patient is admitted to receive collaborative, interdisciplinary care between the physiatrist, rehab nursing staff, and therapy team. 3. Patient's level of medical complexity and substantial therapy needs in context of that medical necessity cannot be provided at a lesser intensity of care such as a SNF. 4. Patient has experienced substantial functional loss from his/her baseline which was documented above under the "Functional History" and "Functional Status" headings. Judging by the patient's diagnosis, physical exam, and functional history, the patient has potential for functional progress which will result in measurable gains while on inpatient rehab. These gains will be of substantial and practical use upon discharge in facilitating mobility and self-care at the household level. 5. Physiatrist  will provide 24 hour management of medical needs as well as oversight of the therapy plan/treatment and provide guidance as appropriate regarding the interaction of the two. 6. 24 hour rehab nursing will assist with bladder management, bowel management, safety, skin/wound care, disease management, medication administration, pain management and patient education and help integrate therapy concepts, techniques,education, etc. 7. PT will assess and treat for/with: Lower extremity strength, range of motion, stamina, balance, functional mobility, safety, adaptive techniques and equipment, knee rom, pain mgt. Goals are: mod I. 8. OT will assess and treat for/with: ADL's, functional mobility, safety, upper extremity  strength, adaptive techniques and equipment, pain mgt. Goals are: mod I. 9. SLP will assess and treat for/with: n/a. Goals are: n/a. 10. Case Management and Social Worker will assess and treat for psychological issues and discharge planning. 11. Team conference will be held weekly to assess progress toward goals and to determine barriers to discharge. 12. Patient will receive at least 3 hours of therapy per day at least 5 days per week. 13. ELOS: 7-10 days  14. Prognosis: excellent   Medical Problem List and Plan:  1. DVT Prophylaxis/Anticoagulation: Pharmaceutical: Coumadin and Lovenox  2. Pain Management: Will add oxycontin for more consistent pain relief. Schedule robaxin qid.  3. Mood: Has a good outlook. Will have LCSW follow for evaluation.  4. Neuropsych: This patient is capable of making decisions on his own behalf.  5. ABLA: Recheck lab in am. May need to add iron supplement  6. Reactive leucocytosis: Monitor wound and for fevers. Recheck CBC for follow up in am. Check ucs  7. Constipation: Will increase senna--sorbitol and enema today as he hasn't emptied bowels completely 8. Hesitancy: will check PVRs. Check UA/UCS. Encouraged patient to sit EOB with assist to help with voiding.   Ranelle Oyster, MD, [facility] Physical Medicine & Rehabilitation   02/05/2013

## 2013-02-05 NOTE — Progress Notes (Signed)
Orthopedic Tech Progress Note Patient Details:  [name] [name] 09-18-1950 [id number] Applied bilateral cpms to lower extremities both at 0-65 start time was 7:30 pm.     [name] 02/05/2013, 7:59 PM

## 2013-02-05 NOTE — Discharge Summary (Signed)
Physician Discharge Summary   Patient ID: [name] [name] MRN: [id number] DOB/AGE: 1950/12/07 62 y.o.  Admit date: 01/31/2013 Discharge date: 02/05/2013  Primary Diagnosis:  Osteoarthritis Bilateral knee(s)  Admission Diagnoses:  Past Medical History  Diagnosis Date  . Restless leg syndrome   . Arthritis    Discharge Diagnoses:   Principal Problem:   OA (osteoarthritis) of knee Active Problems:   Postoperative anemia due to acute blood loss   Hyponatremia   Postop Hypotension  Estimated body mass index is 40.19 kg/(m^2) as calculated from the following:   Height as of this encounter: 5\' 11"  (1.803 m).   Weight as of this encounter: 130.636 kg (288 lb).  Procedure:  Procedure(s) (LRB): TOTAL KNEE BILATERAL (Bilateral)   Consults: CIR  HPI: [name] [name] is a 62 y.o. year old male with end stage OA of both knees with progressively worsening pain and dysfunction. He has constant pain, with activity and at rest and significant functional deficits with difficulties even with ADLs. He has had extensive non-op management including analgesics, injections of cortisone, and home exercise program, but remains in significant pain with significant dysfunction. Radiographs show severe medial and patellofemoral bone on bone with large osteophytes.Options for staged vs bilateral total knee arthroplasty were discussed including pros/cons, procedure risks and potential complications associated with each and he elects to pursue bilateral total knee arthroplasty. He presents now for bilateral Total Knee Arthroplasty.   Laboratory Data: Admission on 01/31/2013  Component Date Value Range Status  . ABO/RH(D) 01/31/2013 B POS   Final  . Antibody Screen 01/31/2013 NEG   Final  . Sample Expiration 01/31/2013 02/03/2013   Final  . ABO/RH(D) 01/31/2013 B POS   Final  . WBC 02/01/2013 13.6* 4.0 - 10.5 K/uL Final  . RBC 02/01/2013 4.05* 4.22 - 5.81 MIL/uL Final  . Hemoglobin 02/01/2013 11.8* 13.0 - 17.0  g/dL Final  . HCT 78/29/5621 34.6* 39.0 - 52.0 % Final  . MCV 02/01/2013 85.4  78.0 - 100.0 fL Final  . MCH 02/01/2013 29.1  26.0 - 34.0 pg Final  . MCHC 02/01/2013 34.1  30.0 - 36.0 g/dL Final  . RDW 30/86/5784 14.1  11.5 - 15.5 % Final  . Platelets 02/01/2013 194  150 - 400 K/uL Final  . Sodium 02/01/2013 131* 135 - 145 mEq/L Final  . Potassium 02/01/2013 4.2  3.5 - 5.1 mEq/L Final  . Chloride 02/01/2013 100  96 - 112 mEq/L Final  . CO2 02/01/2013 25  19 - 32 mEq/L Final  . Glucose, Bld 02/01/2013 153* 70 - 99 mg/dL Final  . BUN 69/62/9528 15  6 - 23 mg/dL Final  . Creatinine, Ser 02/01/2013 1.04  0.50 - 1.35 mg/dL Final  . Calcium 41/32/4401 8.4  8.4 - 10.5 mg/dL Final  . GFR calc non Af Amer 02/01/2013 74* >90 mL/min Final  . GFR calc Af Amer 02/01/2013 86* >90 mL/min Final   Comment: (NOTE)                          The eGFR has been calculated using the CKD EPI equation.                          This calculation has not been validated in all clinical situations.                          eGFR's  persistently <90 mL/min signify possible Chronic Kidney                          Disease.  Marland Kitchen Prothrombin Time 02/01/2013 15.0  11.6 - 15.2 seconds Final  . INR 02/01/2013 1.21  0.00 - 1.49 Final  . WBC 02/02/2013 12.9* 4.0 - 10.5 K/uL Final  . RBC 02/02/2013 3.61* 4.22 - 5.81 MIL/uL Final  . Hemoglobin 02/02/2013 10.7* 13.0 - 17.0 g/dL Final  . HCT 54/01/8118 31.2* 39.0 - 52.0 % Final  . MCV 02/02/2013 86.4  78.0 - 100.0 fL Final  . MCH 02/02/2013 29.6  26.0 - 34.0 pg Final  . MCHC 02/02/2013 34.3  30.0 - 36.0 g/dL Final  . RDW 14/78/2956 14.3  11.5 - 15.5 % Final  . Platelets 02/02/2013 183  150 - 400 K/uL Final  . Sodium 02/02/2013 135  135 - 145 mEq/L Final  . Potassium 02/02/2013 4.2  3.5 - 5.1 mEq/L Final  . Chloride 02/02/2013 105  96 - 112 mEq/L Final  . CO2 02/02/2013 25  19 - 32 mEq/L Final  . Glucose, Bld 02/02/2013 142* 70 - 99 mg/dL Final  . BUN 21/30/8657 14  6 - 23 mg/dL  Final  . Creatinine, Ser 02/02/2013 0.94  0.50 - 1.35 mg/dL Final  . Calcium 84/69/6295 8.5  8.4 - 10.5 mg/dL Final  . GFR calc non Af Amer 02/02/2013 87* >90 mL/min Final  . GFR calc Af Amer 02/02/2013 >90  >90 mL/min Final   Comment: (NOTE)                          The eGFR has been calculated using the CKD EPI equation.                          This calculation has not been validated in all clinical situations.                          eGFR's persistently <90 mL/min signify possible Chronic Kidney                          Disease.  Marland Kitchen Prothrombin Time 02/02/2013 15.1  11.6 - 15.2 seconds Final  . INR 02/02/2013 1.22  0.00 - 1.49 Final  . WBC 02/03/2013 11.7* 4.0 - 10.5 K/uL Final  . RBC 02/03/2013 3.40* 4.22 - 5.81 MIL/uL Final  . Hemoglobin 02/03/2013 10.1* 13.0 - 17.0 g/dL Final  . HCT 28/41/3244 29.5* 39.0 - 52.0 % Final  . MCV 02/03/2013 86.8  78.0 - 100.0 fL Final  . MCH 02/03/2013 29.7  26.0 - 34.0 pg Final  . MCHC 02/03/2013 34.2  30.0 - 36.0 g/dL Final  . RDW 05/26/7251 14.6  11.5 - 15.5 % Final  . Platelets 02/03/2013 171  150 - 400 K/uL Final  . Prothrombin Time 02/03/2013 13.8  11.6 - 15.2 seconds Final  . INR 02/03/2013 1.08  0.00 - 1.49 Final  . Prothrombin Time 02/04/2013 14.2  11.6 - 15.2 seconds Final  . INR 02/04/2013 1.12  0.00 - 1.49 Final  . Prothrombin Time 02/05/2013 14.5  11.6 - 15.2 seconds Final  . INR 02/05/2013 1.15  0.00 - 1.49 Final  Hospital Outpatient Visit on 01/23/2013  Component Date Value Range Status  . aPTT 01/23/2013 30  24 -  37 seconds Final  . Sodium 01/23/2013 140  135 - 145 mEq/L Final  . Potassium 01/23/2013 4.0  3.5 - 5.1 mEq/L Final  . Chloride 01/23/2013 107  96 - 112 mEq/L Final  . CO2 01/23/2013 26  19 - 32 mEq/L Final  . Glucose, Bld 01/23/2013 103* 70 - 99 mg/dL Final  . BUN 47/82/9562 17  6 - 23 mg/dL Final  . Creatinine, Ser 01/23/2013 0.98  0.50 - 1.35 mg/dL Final  . Calcium 13/12/6576 9.0  8.4 - 10.5 mg/dL Final  . Total  Protein 01/23/2013 6.6  6.0 - 8.3 g/dL Final  . Albumin 46/96/2952 3.4* 3.5 - 5.2 g/dL Final  . AST 84/13/2440 17  0 - 37 U/L Final  . ALT 01/23/2013 17  0 - 53 U/L Final  . Alkaline Phosphatase 01/23/2013 72  39 - 117 U/L Final  . Total Bilirubin 01/23/2013 0.5  0.3 - 1.2 mg/dL Final  . GFR calc non Af Amer 01/23/2013 86* >90 mL/min Final  . GFR calc Af Amer 01/23/2013 >90  >90 mL/min Final   Comment: (NOTE)                          The eGFR has been calculated using the CKD EPI equation.                          This calculation has not been validated in all clinical situations.                          eGFR's persistently <90 mL/min signify possible Chronic Kidney                          Disease.  Marland Kitchen Prothrombin Time 01/23/2013 13.3  11.6 - 15.2 seconds Final  . INR 01/23/2013 1.03  0.00 - 1.49 Final  . Color, Urine 01/23/2013 YELLOW  YELLOW Final  . APPearance 01/23/2013 CLEAR  CLEAR Final  . Specific Gravity, Urine 01/23/2013 1.026  1.005 - 1.030 Final  . pH 01/23/2013 6.5  5.0 - 8.0 Final  . Glucose, UA 01/23/2013 NEGATIVE  NEGATIVE mg/dL Final  . Hgb urine dipstick 01/23/2013 SMALL* NEGATIVE Final  . Bilirubin Urine 01/23/2013 NEGATIVE  NEGATIVE Final  . Ketones, ur 01/23/2013 NEGATIVE  NEGATIVE mg/dL Final  . Protein, ur 03/20/2535 NEGATIVE  NEGATIVE mg/dL Final  . Urobilinogen, UA 01/23/2013 1.0  0.0 - 1.0 mg/dL Final  . Nitrite 64/40/3474 NEGATIVE  NEGATIVE Final  . Leukocytes, UA 01/23/2013 NEGATIVE  NEGATIVE Final  . MRSA, PCR 01/23/2013 NEGATIVE  NEGATIVE Final  . Staphylococcus aureus 01/23/2013 NEGATIVE  NEGATIVE Final   Comment:                                 The Xpert SA Assay (FDA                          approved for NASAL specimens                          in patients over 71 years of age),  is one component of                          a comprehensive surveillance                          program.  Test performance has                           been validated by [facility] for patients greater                          than or equal to 28 year old.                          It is not intended                          to diagnose infection nor to                          guide or monitor treatment.  . Squamous Epithelial / LPF 01/23/2013 RARE  RARE Final  . WBC, UA 01/23/2013 0-2  <3 WBC/hpf Final  . RBC / HPF 01/23/2013 3-6  <3 RBC/hpf Final  . Bacteria, UA 01/23/2013 RARE  RARE Final  . Daryll Drown 01/23/2013 MUCOUS PRESENT   Final     X-Rays:No results found.  EKG:No orders found for this or any previous visit.   Hospital Course: Patient was admitted to [facility] and taken to the OR and underwent the above stated procedure well without complications.  Patient tolerated the procedure well and was later transferred to the recovery room and then to the orthopaedic floor for postoperative care. Anesthesia was consulted postoperatively to place an epidural in for postoperative pain management. The patient was also given PO and IV analgesics for pain control following their surgery.  They were given 24 hours of postoperative antibiotics and started on DVT prophylaxis in the form of Lovenox and Coumadin after the epidural had been removed.   PT and OT were ordered for total joint protocol.  Discharge planning consulted to help with postop disposition and equipment needs.  CIR was consulted to assess the possibility of [facility] rehab.  Patient had a tough night on the evening of surgery and started to get up OOB with therapy on day one.  Hemovac drains were pulled without difficulty on day one.  The epidural was adjusted for better pain control.  He did have a bout of hypotension after epidural bolus but responded well to fluids. Continued to work with therapy into day two walking about 75 feet.  Dressings were changed on day two and both incisions were healing well.  The epidural was removed without  difficulty by Anesthesia on day two.  By day three, the patient started to show progress with therapy. He remained in the hospital over the weekend on day 3 and 4. They continued to receive therapy each day for continued total knee protocol.  The incisions were healing well.  They continued to progress over the weekend and on POD 5 the patient was seen in  rounds and was ready to go CIR as long as the bed was available.  Discharge Medications: Prior to Admission medications   Medication Sig Start Date End Date Taking? Authorizing Provider  bisacodyl (DULCOLAX) 10 MG suppository Place 1 suppository (10 mg total) rectally daily as needed. 02/05/13   Alexzandrew Perkins, PA-C  docusate sodium 100 MG CAPS Take 100 mg by mouth 2 (two) times daily. 02/05/13   Alexzandrew Perkins, PA-C  enoxaparin (LOVENOX) 30 MG/0.3ML injection Inject 0.3 mLs (30 mg total) into the skin every 12 (twelve) hours. Continue Lovenox injections until the INR is therapeutic at or greater than 2.0.  When INR reaches the therapeutic level of equal to or greater than 2.0, the patient may discontinue the Lovenox injections. 02/05/13   Alexzandrew Julien Girt, PA-C  methocarbamol (ROBAXIN) 500 MG tablet Take 1 tablet (500 mg total) by mouth every 6 (six) hours as needed. 02/05/13   Alexzandrew Julien Girt, PA-C  metoCLOPramide (REGLAN) 5 MG tablet Take 1-2 tablets (5-10 mg total) by mouth every 8 (eight) hours as needed (if ondansetron (ZOFRAN) ineffective.). 02/05/13   Alexzandrew Perkins, PA-C  ondansetron (ZOFRAN) 4 MG tablet Take 1 tablet (4 mg total) by mouth every 6 (six) hours as needed for nausea. 02/05/13   Alexzandrew Perkins, PA-C  oxyCODONE (OXY IR/ROXICODONE) 5 MG immediate release tablet Take 1-4 tablets (5-20 mg total) by mouth every 3 (three) hours as needed. 02/05/13   Alexzandrew Perkins, PA-C  polyethylene glycol (MIRALAX / GLYCOLAX) packet Take 17 g by mouth daily as needed. 02/05/13   Alexzandrew Perkins, PA-C  traMADol (ULTRAM) 50  MG tablet Take 1-2 tablets (50-100 mg total) by mouth every 6 (six) hours as needed (mild pain). 02/05/13   Alexzandrew Julien Girt, PA-C  warfarin (COUMADIN) 7.5 MG tablet Take 1 tablet (7.5 mg total) by mouth daily. Take Coumadin for four weeks and then discontinue.  The dose may need to be adjusted based upon the INR.  Please follow the INR and titrate Coumadin dose for a therapeutic range between 2.0 and 3.0 INR.  After four weeks of Coumadin, the patient may stop the Coumadin and take 81 mg Aspirin daily for four more weeks. 02/05/13   Alexzandrew Julien Girt, PA-C    Diet: Regular diet Activity:WBAT Follow-up:in 2 weeks from surgery Disposition - Rehab Discharged Condition: good   Discharge Orders   Future Orders Complete By Expires   Call MD / Call 911  As directed    Comments:     If you experience chest pain or shortness of breath, CALL 911 and be transported to the hospital emergency room.  If you develope a fever above 101 F, pus (white drainage) or increased drainage or redness at the wound, or calf pain, call your surgeon's office.   Change dressing  As directed    Comments:     Change dressing daily with sterile 4 x 4 inch gauze dressing and apply TED hose. Do not submerge the incision under water.   Constipation Prevention  As directed    Comments:     Drink plenty of fluids.  Prune juice may be helpful.  You may use a stool softener, such as Colace (over the counter) 100 mg twice a day.  Use MiraLax (over the counter) for constipation as needed.   CPM  As directed    Comments:     Continuous passive motion machine (CPM):      Use the CPM for 4 hours per day.      You may  increase by 5-10 per day.  You may break it up into 2 or 3 sessions per day.      Use CPM for 2 weeks or until you are release from CIR.   Diet general  As directed    Discharge instructions  As directed    Comments:     Pick up stool softner and laxative for home. Do not submerge incision under water. May  shower. Continue to use ice for pain and swelling from surgery.  Take Coumadin for 4 weeks and then discontinue.  The dose may need to be adjusted based upon the INR.  Please follow the INR and titrate Coumadin dose for a therapeutic range between 2.0 and 3.0 INR.  After 4 weeks of Coumadin, the patient may stop the Coumadin and take 81 mg Aspirin daily for four more weeks.  Continue Lovenox injections until the INR is therapeutic at or greater than 2.0.  When INR reaches the therapeutic level of equal to or greater than 2.0, the patient may discontinue the Lovenox injections.  When discharged from the skilled rehab facility, please have the facility set up the patient's Home Health Physical Therapy prior to being released.  Also provide the patient with their medications at time of release from the facility to include their pain medication, the muscle relaxants, and their blood thinner medication.  If the patient is still at the rehab facility at time of follow up appointment, please also assist the patient in arranging follow up appointment in our office and any transportation needs.   Do not put a pillow under the knee. Place it under the heel.  As directed    Do not sit on low chairs, stoools or toilet seats, as it may be difficult to get up from low surfaces  As directed    Driving restrictions  As directed    Comments:     No driving until released by the physician.   Increase activity slowly as tolerated  As directed    Lifting restrictions  As directed    Comments:     No lifting until released by the physician.   Patient may shower  As directed    Comments:     You may shower without a dressing once there is no drainage.  Do not wash over the wound.  If drainage remains, do not shower until drainage stops.   TED hose  As directed    Comments:     Use stockings (TED hose) for 3 weeks on both leg(s).  You may remove them at night for sleeping.   Weight bearing as tolerated  As directed         Medication List         bisacodyl 10 MG suppository  Commonly known as:  DULCOLAX  Place 1 suppository (10 mg total) rectally daily as needed.     DSS 100 MG Caps  Take 100 mg by mouth 2 (two) times daily.     enoxaparin 30 MG/0.3ML injection  Commonly known as:  LOVENOX  Inject 0.3 mLs (30 mg total) into the skin every 12 (twelve) hours. Continue Lovenox injections until the INR is therapeutic at or greater than 2.0.  When INR reaches the therapeutic level of equal to or greater than 2.0, the patient may discontinue the Lovenox injections.     methocarbamol 500 MG tablet  Commonly known as:  ROBAXIN  Take 1 tablet (500 mg total) by mouth every 6 (six) hours  as needed.     metoCLOPramide 5 MG tablet  Commonly known as:  REGLAN  Take 1-2 tablets (5-10 mg total) by mouth every 8 (eight) hours as needed (if ondansetron (ZOFRAN) ineffective.).     ondansetron 4 MG tablet  Commonly known as:  ZOFRAN  Take 1 tablet (4 mg total) by mouth every 6 (six) hours as needed for nausea.     oxyCODONE 5 MG immediate release tablet  Commonly known as:  Oxy IR/ROXICODONE  Take 1-4 tablets (5-20 mg total) by mouth every 3 (three) hours as needed.     polyethylene glycol packet  Commonly known as:  MIRALAX / GLYCOLAX  Take 17 g by mouth daily as needed.     traMADol 50 MG tablet  Commonly known as:  ULTRAM  Take 1-2 tablets (50-100 mg total) by mouth every 6 (six) hours as needed (mild pain).     warfarin 7.5 MG tablet  Commonly known as:  COUMADIN  Take 1 tablet (7.5 mg total) by mouth daily. Take Coumadin for four weeks and then discontinue.  The dose may need to be adjusted based upon the INR.  Please follow the INR and titrate Coumadin dose for a therapeutic range between 2.0 and 3.0 INR.  After four weeks of Coumadin, the patient may stop the Coumadin and take 81 mg Aspirin daily for four more weeks.           Follow-up Information   Follow up with Loanne Drilling, MD.  Schedule an appointment as soon as possible for a visit in 2 weeks.   Specialty:  Orthopedic Surgery   Contact information:   [address] [place] [address] [phone]       Signed: Patrica Duel 02/05/2013, 7:20 AM

## 2013-02-05 NOTE — Progress Notes (Signed)
Rehab admissions - I have insurance approval and can admit to inpatient rehab today.  Call me for questions.  #[phone]

## 2013-02-05 NOTE — PMR Pre-admission (Signed)
PMR Admission Coordinator Pre-Admission Assessment  Patient: [name] [name] is an 62 y.o., male MRN: [id number] DOB: November 05, 1950 Height: 5\' 11"  (180.3 cm) Weight: 130.636 kg (288 lb)              Insurance Information HMO:      PPO: Yes     PCP:       IPA:       80/20:       OTHER:  Group [id number] PRIMARY: [employer] [place]      Policy#: [id number]      Subscriber: Marisa Cyphers CM Name: Christie Beckers      Phone#: [phone]     Fax#: [phone] Pre-Cert#: [id number]  Precert X 10 days with update due on 02/13/13      Employer: [employer] Benefits:  Phone #: [phone]     Name: Kelle Darting. Date: 01/23/12     Deduct: $5000(met)      Out of Pocket Max: $5000(met)      Life Max: None CIR: 100%      SNF: 100%   60 days Outpatient: 100%    30 visits     Co-Pay:  None Home Health: 100%      Co-Pay: none DME: 100%     Co-Pay: none Providers: in network   Emergency Contact Information Contact Information   Name Relation Home Work Mobile   Holsapple,Gail Spouse [phone]   [place], Cassatt   [phone]     Current Medical History  Patient Admitting Diagnosis:  B TKR  History of Present Illness: A 62 y.o. male with history of RLS, endstage OA bilateral knees who elected to undergo B-TKR on 01/31/13 by Dr. [name]. Post op WBAT and on coumadin/lovenox bridge for DVT prophylaxis. He did have hypotension after epidural bolus that responded well to fluid resuscitation. ABLA noted with hgb at 10.1. On epidural for pain management. PT/OT initiated and CIR recommended by therapy team.   Past Medical History  Past Medical History  Diagnosis Date  . Restless leg syndrome   . Arthritis     Family History  family history includes Stroke in his father.  Prior Rehab/Hospitalizations: No recent rehab.     Current Medications  Current facility-administered medications:bisacodyl (DULCOLAX) suppository 10 mg, 10 mg, Rectal, Daily PRN, Loanne Drilling, MD;  diphenhydrAMINE  (BENADRYL) capsule 25 mg, 25 mg, Oral, Q4H PRN, Azell Der, MD, 25 mg at 02/02/13 2351;  diphenhydrAMINE (BENADRYL) injection 12.5 mg, 12.5 mg, Intravenous, Q4H PRN, Azell Der, MD;  diphenhydrAMINE (BENADRYL) injection 25 mg, 25 mg, Intramuscular, Q4H PRN, Azell Der, MD docusate sodium (COLACE) capsule 100 mg, 100 mg, Oral, BID, Loanne Drilling, MD, 100 mg at 02/05/13 0735;  enoxaparin (LOVENOX) injection 30 mg, 30 mg, Subcutaneous, Q12H, Alexzandrew Perkins, PA-C, 30 mg at 02/05/13 3151;  menthol-cetylpyridinium (CEPACOL) lozenge 3 mg, 1 lozenge, Oral, PRN, Loanne Drilling, MD;  meperidine (DEMEROL) injection 6.25 mg, 6.25 mg, Intravenous, Q5 min PRN, Azell Der, MD methocarbamol (ROBAXIN) 500 mg in dextrose 5 % 50 mL IVPB, 500 mg, Intravenous, Q6H PRN, Loanne Drilling, MD;  methocarbamol (ROBAXIN) tablet 500 mg, 500 mg, Oral, Q6H PRN, Loanne Drilling, MD, 500 mg at 02/05/13 0227;  metoCLOPramide (REGLAN) injection 5-10 mg, 5-10 mg, Intravenous, Q8H PRN, Loanne Drilling, MD;  metoCLOPramide (REGLAN) tablet 5-10 mg, 5-10 mg, Oral, Q8H PRN, Loanne Drilling, MD morphine 2 MG/ML injection 1-2 mg, 1-2 mg, Intravenous, Q2H PRN, [name] [name],  MD, 1 mg at 02/02/13 1130;  naloxone (NARCAN) 2 mg in dextrose 5 % 250 mL infusion, 1-4 mcg/kg/hr, Intravenous, Continuous PRN, Azell Der, MD;  naloxone [facility]) injection 0.4 mg, 0.4 mg, Intravenous, PRN, Azell Der, MD;  ondansetron (ZOFRAN) injection 4 mg, 4 mg, Intravenous, Q6H PRN, Loanne Drilling, MD ondansetron (ZOFRAN) tablet 4 mg, 4 mg, Oral, Q6H PRN, Loanne Drilling, MD;  oxyCODONE (Oxy IR/ROXICODONE) immediate release tablet 5-20 mg, 5-20 mg, Oral, Q3H PRN, Loanne Drilling, MD, 10 mg at 02/05/13 0735;  phenol (CHLORASEPTIC) mouth spray 1 spray, 1 spray, Mouth/Throat, PRN, Loanne Drilling, MD;  polyethylene glycol (MIRALAX / GLYCOLAX) packet 17 g, 17 g, Oral, Daily PRN, Loanne Drilling, MD sodium chloride 0.9 % injection 3 mL, 3 mL,  Intravenous, PRN, Azell Der, MD;  traMADol Janean Sark) tablet 50-100 mg, 50-100 mg, Oral, Q6H PRN, Loanne Drilling, MD, 100 mg at 02/02/13 2124;  warfarin (COUMADIN) video, , Does not apply, Once, Theda Sers, RPH;  Warfarin - Pharmacist Dosing Inpatient, , Does not apply, q1800, Theda Sers, RPH  Patients Current Diet: General  Precautions / Restrictions Precautions Precautions: Fall;Knee Restrictions Weight Bearing Restrictions: No Other Position/Activity Restrictions: WBAT   Prior Activity Level Community (5-7x/wk): Went out daily.  worked [employer].  Driving PTA.  Home Assistive Devices / Equipment Home Assistive Devices/Equipment: Eyeglasses  Prior Functional Level Prior Function Level of Independence: Independent  Current Functional Level Cognition  Overall Cognitive Status: Within Functional Limits for tasks assessed Orientation Level: Oriented X4    Extremity Assessment (includes Sensation/Coordination)          ADLs  Grooming: Performed;Wash/dry hands;Wash/dry face;Set up Where Assessed - Grooming: Supported sitting Upper Body Bathing: Simulated;Supervision/safety;Set up Where Assessed - Upper Body Bathing: Supported sitting Lower Body Bathing: Simulated;+1 Total assistance Upper Body Dressing: Performed;Supervision/safety;Set up Lower Body Dressing: +1 Total assistance Toilet Transfer: +2 Total assistance Toilet Transfer: Patient Percentage: 80% Toilet Transfer Method: Sit to stand Toilet Transfer Equipment:  (Bed (raised)>out door/down hall>sit in recliner) Toileting - Clothing Manipulation and Hygiene: +1 Total assistance Where Assessed - Toileting Clothing Manipulation and Hygiene: Standing Tub/Shower Transfer Method: Not assessed Equipment Used: Rolling walker Transfers/Ambulation Related to ADLs: total A +2 (pt=80%) sit<>stand and ambulation; cues to "walk"legs out as he sits down ADL Comments: Pt ptovided with educaton and demo of ADL A/E and  education and picture provided of tub bench    Mobility  Bed Mobility: Sit to Supine Supine to Sit: 4: Min assist;HOB elevated;With rails (for RLE) Supine to Sit: Patient Percentage: 60% Sitting - Scoot to Edge of Bed: 4: Min assist (for RLE) Sit to Supine: 3: Mod assist Sit to Supine: Patient Percentage: 50%    Transfers  Transfers: Sit to Stand;Stand to Sit Sit to Stand: 1: +2 Total assist;From elevated surface;With upper extremity assist;With armrests;From chair/3-in-1 Sit to Stand: Patient Percentage: 80% Stand to Sit: 1: +2 Total assist;With upper extremity assist;With armrests;To bed Stand to Sit: Patient Percentage: 80%    Ambulation / Gait / Stairs / Wheelchair Mobility  Ambulation/Gait Ambulation/Gait Assistance: 4: Min assist;3: Mod assist Ambulation/Gait: Patient Percentage: 80% Ambulation Distance (Feet): 98 Feet Assistive device: Rolling walker Ambulation/Gait Assistance Details: cues for posture, stride length and position from RW Gait Pattern: Step-to pattern;Decreased step length - right;Decreased step length - left;Shuffle;Trunk flexed Stairs: No    Posture / Balance      Special needs/care consideration BiPAP/CPAP No CPM Yes Continuous Drip IV No Dialysis No  Life Vest No Oxygen No Special Bed No Trach Size No Wound Vac (area) No    Skin Has eczema/psorriasis L knee, has B knee incisions                             Bowel mgmt: No BM since admission Bladder mgmt: Voiding in urinal Diabetic mgmt No    Previous Home Environment Living Arrangements: Spouse/significant other Home Care Services: No  Discharge Living Setting Plans for Discharge Living Setting: Patient's home;House;Lives with (comment) (Lives with wife and a son home from [employer].) Type of Home at Discharge: House Discharge Home Layout: Two level;Able to live on main level with bedroom/bathroom Discharge Home Access: Stairs to enter Entrance Stairs-Number of Steps: 8 very small  steps at entry. Does the patient have any problems obtaining your medications?: No  Social/Family/Support Systems Patient Roles: Spouse;Parent Contact Information: Riese Hellard - wife Anticipated Caregiver: wife and son Anticipated Caregiver's Contact Information: Jaymen Fetch - son [phone] Ability/Limitations of Caregiver: Wife works as a Runner, broadcasting/film/video at [employer], 2nd grade.  Son is home and not currently working and can assist.  2nd son lives in [place]. Caregiver Availability: 24/7 Discharge Plan Discussed with Primary Caregiver: Yes Is Caregiver In Agreement with Plan?: Yes Does Caregiver/Family have Issues with Lodging/Transportation while Pt is in Rehab?: No  Goals/Additional Needs Patient/Family Goal for Rehab: PT/OT mod I goals, no ST needs Expected length of stay: 7-10 days Cultural Considerations: Attends [employer].  Teaches monthly. Dietary Needs: Regular, thin liquids Equipment Needs: TBD Pt/Family Agrees to Admission and willing to participate: Yes Program Orientation Provided & Reviewed with Pt/Caregiver Including Roles  & Responsibilities: Yes   Decrease burden of Care through IP rehab admission: N/A  Possible need for SNF placement upon discharge: Not likely   Patient Condition: This patient's medical and functional status has changed since the consult dated:  02/01/13 in which the Rehabilitation Physician determined and documented that the patient's condition is appropriate for intensive rehabilitative care in an inpatient rehabilitation facility. See "History of Present Illness" (above) for medical update. Functional changes are: Currently ambulating 98 ft with min/mod assist with RW. Patient's medical and functional status update has been discussed with the Rehabilitation physician and patient remains appropriate for inpatient rehabilitation. Will admit to inpatient rehab today.  Preadmission Screen Completed By:  Trish Mage, 02/05/2013 11:00  AM ______________________________________________________________________   Discussed status with Dr. Riley Kill on 02/05/13 at 1106 and received telephone approval for admission today.  Admission Coordinator:  Trish Mage, time1106/Date09/15/14

## 2013-02-05 NOTE — Progress Notes (Signed)
Pt being transferred to CIR. Report given to Chales Abrahams on 4W. [facility] called for transport. Pt premedicated for pain prior to transport.

## 2013-02-05 NOTE — Progress Notes (Signed)
Rehab admissions - I met with patient this am and his wife.  I have called and faxed updates to [facility] requesting inpatient rehab admission.  I hope to hear back from [facility] today.  I do have a bed available on rehab if I get insurance approval.  I will follow up once I hear back from insurance case manager.  Call me for questions.  #[phone]

## 2013-02-05 NOTE — Progress Notes (Signed)
Received pt. As a transfer from [facility].Pt. Is from home with his wife.Pt. Is alert and oriented.Pt's skin with out pressure sores.Pt's bottom is red and blanchable,pt. Has a big bruise on his right hip,and some small bruises on his lt. Arm.keep monitoring pt. Closely and assessing his needs.

## 2013-02-05 NOTE — Progress Notes (Signed)
Physical Therapy Treatment Patient Details Name: [name] [name] MRN: [id number] DOB: January 02, 1951 Today's Date: 02/05/2013 Time: 0817-0905 PT Time Calculation (min): 48 min  PT Assessment / Plan / Recommendation  History of Present Illness Pt with B TKRs   PT Comments     Follow Up Recommendations  CIR     Does the patient have the potential to tolerate intense rehabilitation     Barriers to Discharge        Equipment Recommendations  None recommended by PT    Recommendations for Other Services OT consult  Frequency 7X/week   Progress towards PT Goals Progress towards PT goals: Progressing toward goals  Plan Current plan remains appropriate    Precautions / Restrictions Precautions Precautions: Fall;Knee Required Braces or Orthoses: Knee Immobilizer - Right Knee Immobilizer - Right: Discontinue once straight leg raise with < 10 degree lag Knee Immobilizer - Left: Discontinue once straight leg raise with < 10 degree lag Restrictions Weight Bearing Restrictions: No Other Position/Activity Restrictions: WBAT   Pertinent Vitals/Pain    Mobility  Bed Mobility Bed Mobility: Supine to Sit;Sit to Supine Supine to Sit: 4: Min assist;HOB elevated;With rails Sitting - Scoot to Edge of Bed: 4: Min assist Sit to Supine: 4: Min assist Details for Bed Mobility Assistance: cues for sequence and assist for LEs Transfers Transfers: Sit to Stand;Stand to Sit Sit to Stand: 1: +2 Total assist;From elevated surface;With upper extremity assist;With armrests;From chair/3-in-1;From bed Sit to Stand: Patient Percentage: 80% Stand to Sit: 1: +2 Total assist;With upper extremity assist;With armrests;To bed;To chair/3-in-1;To elevated surface Stand to Sit: Patient Percentage: 90% Details for Transfer Assistance: Cues to "walk" legs out as he sat down Ambulation/Gait Ambulation/Gait Assistance: 4: Min assist;3: Mod assist Ambulation/Gait: Patient Percentage: 80% Ambulation Distance (Feet): 125  Feet Assistive device: Rolling walker Ambulation/Gait Assistance Details: cues for stride length, position from RW and posture Gait Pattern: Step-to pattern;Step-through pattern;Antalgic;Trunk flexed    Exercises Total Joint Exercises Ankle Circles/Pumps: AROM;Both;Supine;20 reps Quad Sets: AROM;Both;Supine;20 reps Heel Slides: AAROM;Both;Supine;20 reps Straight Leg Raises: Both;Supine;20 reps;AROM;AAROM   PT Diagnosis:    PT Problem List:   PT Treatment Interventions:     PT Goals (current goals can now be found in the care plan section) Acute Rehab PT Goals Patient Stated Goal: Resume previous lifestyle with decreased pain PT Goal Formulation: With patient Time For Goal Achievement: 02/08/13 Potential to Achieve Goals: Good  Visit Information  Last PT Received On: 02/05/13 Assistance Needed: +2 History of Present Illness: Pt with B TKRs    Subjective Data  Patient Stated Goal: Resume previous lifestyle with decreased pain   Cognition  Cognition Arousal/Alertness: Awake/alert Behavior During Therapy: WFL for tasks assessed/performed Overall Cognitive Status: Within Functional Limits for tasks assessed    Balance     End of Session PT - End of Session Equipment Utilized During Treatment: Gait belt Activity Tolerance: Patient tolerated treatment well Patient left: in bed;with call bell/phone within reach Nurse Communication: Mobility status   GP     Brandy Zuba 02/05/2013, 11:54 AM

## 2013-02-05 NOTE — Progress Notes (Signed)
ANTICOAGULATION CONSULT NOTE - Follow Up  Pharmacy Consult for Coumadin Indication: VTE prophylaxis  Allergies  Allergen Reactions  . Penicillins     REACTION: swelling   Labs:  Recent Labs  02/03/13 0434 02/04/13 0505 02/05/13 0415  HGB 10.1*  --   --   HCT 29.5*  --   --   PLT 171  --   --   LABPROT 13.8 14.2 14.5  INR 1.08 1.12 1.15    Assessment: 61 yoM s/p bilateral TKA 9/10 with indwelling epidural catheter. MD ordered to have Coumadin started for VTE prophylaxis 9/11. Coumadin score = 6.   INR subtherapeutic at 1.15 - no real change after doses of 4mg , 7.5mg , 7.5mg , and 10mg   CBC stable  Epidural removed 9/12 11:15am, Lovenox to start 12 hours after epidural removal.   Plan Lovenox 30mg  q12 until INR is tx, Coumadin x 4 weeks.   Goal of Therapy:  INR 2-3 Monitor platelets by anticoagulation protocol: Yes   Plan:  1) Repeat warfarin 10mg  tonight - anticipate INR to start responding tomorrow 2) Daily INR 3) Continue Lovenox 30mg  q12 until INR therapeutic   Hessie Knows, PharmD, BCPS Pager [phone] 02/05/2013 12:28 PM

## 2013-02-05 NOTE — Progress Notes (Signed)
Orthopedic Tech Progress Note Patient Details:  [name] [name] 1951-04-21 [id number]  CPM Left Knee CPM Left Knee: On Left Knee Flexion (Degrees): 65 Left Knee Extension (Degrees): 0 CPM Right Knee CPM Right Knee: On Right Knee Flexion (Degrees): 65 Right Knee Extension (Degrees): 0   [name] 02/05/2013, 8:03 PM

## 2013-02-05 NOTE — Progress Notes (Signed)
   Subjective: 5 Days Post-Op Procedure(s) (LRB): TOTAL KNEE BILATERAL (Bilateral) Patient reports pain as mild.   Plan is to go Rehab after hospital stay.  Objective: Vital signs in last 24 hours: Temp:  [98.1 F (36.7 C)-98.6 F (37 C)] 98.1 F (36.7 C) (09/15 0500) Pulse Rate:  [86-100] 86 (09/15 0500) Resp:  [16-18] 16 (09/15 0500) BP: (120-140)/(67-83) 120/71 mmHg (09/15 0500) SpO2:  [94 %-100 %] 94 % (09/15 0500)  Intake/Output from previous day:  Intake/Output Summary (Last 24 hours) at 02/05/13 0654 Last data filed at 02/05/13 0500  Gross per 24 hour  Intake    360 ml  Output   1100 ml  Net   -740 ml    Intake/Output this shift: Total I/O In: 120 [P.O.:120] Out: 900 [Urine:900]  Labs:  Recent Labs  02/03/13 0434  HGB 10.1*    Recent Labs  02/03/13 0434  WBC 11.7*  RBC 3.40*  HCT 29.5*  PLT 171   No results found for this basename: NA, K, CL, CO2, BUN, CREATININE, GLUCOSE, CALCIUM,  in the last 72 hours  Recent Labs  02/04/13 0505 02/05/13 0415  INR 1.12 1.15    EXAM General - Patient is Alert, Appropriate and Oriented Extremity - Neurologically intact Neurovascular intact No cellulitis present Compartment soft Dressing/Incision - clean, dry, no drainage Motor Function - intact, moving foot and toes well on exam.   Past Medical History  Diagnosis Date  . Restless leg syndrome   . Arthritis     Assessment/Plan: 5 Days Post-Op Procedure(s) (LRB): TOTAL KNEE BILATERAL (Bilateral) Principal Problem:   OA (osteoarthritis) of knee Active Problems:   Postoperative anemia due to acute blood loss   Hyponatremia   Postop Hypotension   Discharge to SNF vs [facility] rehab today. Awaiting insurance approval Dulcolax suppository this AM  DVT Prophylaxis - Lovenox and Coumadin Weight-Bearing as tolerated to bilateral legs  Rondi Ivy V 02/05/2013, 6:54 AM

## 2013-02-06 ENCOUNTER — Inpatient Hospital Stay (HOSPITAL_COMMUNITY): Payer: BC Managed Care – PPO | Admitting: *Deleted

## 2013-02-06 ENCOUNTER — Inpatient Hospital Stay (HOSPITAL_COMMUNITY): Payer: BC Managed Care – PPO

## 2013-02-06 DIAGNOSIS — Z96659 Presence of unspecified artificial knee joint: Secondary | ICD-10-CM

## 2013-02-06 DIAGNOSIS — M1710 Unilateral primary osteoarthritis, unspecified knee: Secondary | ICD-10-CM

## 2013-02-06 DIAGNOSIS — D62 Acute posthemorrhagic anemia: Secondary | ICD-10-CM

## 2013-02-06 LAB — PROTIME-INR: Prothrombin Time: 17 seconds — ABNORMAL HIGH (ref 11.6–15.2)

## 2013-02-06 LAB — URINE CULTURE: Colony Count: NO GROWTH

## 2013-02-06 LAB — COMPREHENSIVE METABOLIC PANEL
AST: 21 U/L (ref 0–37)
Albumin: 2.4 g/dL — ABNORMAL LOW (ref 3.5–5.2)
Alkaline Phosphatase: 56 U/L (ref 39–117)
Chloride: 99 mEq/L (ref 96–112)
Potassium: 3.5 mEq/L (ref 3.5–5.1)
Sodium: 136 mEq/L (ref 135–145)
Total Bilirubin: 0.8 mg/dL (ref 0.3–1.2)
Total Protein: 5.7 g/dL — ABNORMAL LOW (ref 6.0–8.3)

## 2013-02-06 LAB — CBC WITH DIFFERENTIAL/PLATELET
Eosinophils Absolute: 0.4 10*3/uL (ref 0.0–0.7)
Eosinophils Relative: 6 % — ABNORMAL HIGH (ref 0–5)
HCT: 28 % — ABNORMAL LOW (ref 39.0–52.0)
Lymphocytes Relative: 23 % (ref 12–46)
Lymphs Abs: 1.6 10*3/uL (ref 0.7–4.0)
MCH: 29.6 pg (ref 26.0–34.0)
MCV: 85.4 fL (ref 78.0–100.0)
Monocytes Absolute: 1 10*3/uL (ref 0.1–1.0)
Platelets: 209 10*3/uL (ref 150–400)
RDW: 14 % (ref 11.5–15.5)

## 2013-02-06 MED ORDER — POLYETHYLENE GLYCOL 3350 17 G PO PACK
17.0000 g | PACK | Freq: Once | ORAL | Status: AC
  Administered 2013-02-06: 17 g via ORAL
  Filled 2013-02-06: qty 1

## 2013-02-06 MED ORDER — POLYSACCHARIDE IRON COMPLEX 150 MG PO CAPS
150.0000 mg | ORAL_CAPSULE | Freq: Two times a day (BID) | ORAL | Status: DC
  Administered 2013-02-06 – 2013-02-09 (×7): 150 mg via ORAL
  Filled 2013-02-06 (×8): qty 1

## 2013-02-06 NOTE — Progress Notes (Signed)
Physical Therapy Session Note  Patient Details  Name: [name] [name] MRN: [id number] Date of Birth: 1951/03/14  Today's Date: 02/06/2013 Time: 7829-5621 Time Calculation (min): 30 min  Short Term Goals: Week 1:  PT Short Term Goal 1 (Week 1): STGs=LTGs due to short ELOS  Skilled Therapeutic Interventions/Progress Updates:  Pt resting in bed. Educated pt on optimal positioning for ROM outcomes. Locked bed with LEs extended.  Tx focused on gait training and therex for ROM and strength.  Supine<>sit with S. Transfers with S/min-guard due to pt unsing momentum to rise and steady. Looks unsafe, but no LOB.   Gait training 2x150' with close S and cues for increasing knee flexion in swing and ext in stance as able.   Nustep x70min with bil UE and LEs, seat 12>>11 for increased flexion.   Pt left in bed with ice packs at end of tx. All needs in reach. Reminded of having staff for transfers.     Therapy Documentation Precautions:  Precautions Precautions: Fall;Knee Precaution Comments: SLR without 10 degree lag on 02/06/13 bilaterally Required Braces or Orthoses: Knee Immobilizer - Right Knee Immobilizer - Right: Discontinue once straight leg raise with < 10 degree lag Knee Immobilizer - Left: Discontinue once straight leg raise with < 10 degree lag Restrictions Weight Bearing Restrictions: No Other Position/Activity Restrictions: WBAT General: Chart Reviewed: Yes Family/Caregiver Present: No Vital Signs:   Pain: 4/10, ice provided  See FIM for current functional status  Therapy/Group: Individual Therapy  Clydene Laming, PT   02/06/2013, 11:48 AM

## 2013-02-06 NOTE — Evaluation (Signed)
Physical Therapy Assessment and Plan  Patient Details  Name: [name] [name] MRN: [id number] Date of Birth: 12/07/1950  PT Diagnosis: Difficulty walking, Osteoarthritis and Pain in B knees Rehab Potential: Excellent ELOS: 5-7 days   Today's Date: 02/06/2013 Time: 0930-1030 Time Calculation (min): 60 min  Problem List:  Patient Active Problem List   Diagnosis Date Noted  . Leucocytosis 02/05/2013  . Postoperative anemia due to acute blood loss 02/01/2013  . Hyponatremia 02/01/2013  . Postop Hypotension 02/01/2013  . OA (osteoarthritis) of bilateral knees 01/31/2013    Past Medical History:  Past Medical History  Diagnosis Date  . Restless leg syndrome   . Arthritis    Past Surgical History:  Past Surgical History  Procedure Laterality Date  . Right leg surgery       torn ligament and cartilage in 1972   . Torn retina       1995   . Cataract srugery       bilateral   . Left leg surgery     . Cholecystectomy    . Total knee arthroplasty Bilateral 01/31/2013    Procedure: TOTAL KNEE BILATERAL;  Surgeon: Loanne Drilling, MD;  Location: [facility];  Service: Orthopedics;  Laterality: Bilateral;    Assessment & Plan Clinical Impression: [name] [name] is a 62 y.o. male with history of RLS, endstage OA bilateral knees who elected to undergo B-TKR on 01/31/13 by Dr. [name]. Post op WBAT and on coumadin/lovenox bridge for DVT prophylaxis. He did have hypotension after epidural bolus that responded well to fluid resuscitation. ABLA noted with hgb at 10.1. On epidural for pain management. PT/OT initiated and CIR recommended by therapy team. The patient was ultimately admitted today. Patient transferred to CIR on 02/05/2013 .   Patient currently requires supervision with mobility secondary to muscle weakness.  Prior to hospitalization, patient was independent  with mobility and lived with Spouse in a House home.  Home access is 7Stairs to enter;Other (comment) (RW fits on  stairs).  Patient will benefit from skilled PT intervention to maximize safe functional mobility, minimize fall risk and decrease caregiver burden for planned discharge home with intermittent assist.  Anticipate patient will benefit from follow up OP at discharge.  PT - End of Session Activity Tolerance: Tolerates 30+ min activity without fatigue Endurance Deficit: No PT Assessment Rehab Potential: Excellent Barriers to Discharge: Inaccessible home environment PT Patient demonstrates impairments in the following area(s): Balance;Endurance;Motor;Pain;Safety PT Transfers Functional Problem(s): Bed Mobility;Bed to Chair;Car;Furniture PT Locomotion Functional Problem(s): Ambulation;Wheelchair Mobility;Stairs PT Plan PT Intensity: Minimum of 1-2 x/day ,45 to 90 minutes PT Frequency: 5 out of 7 days PT Duration Estimated Length of Stay: 5-7 days PT Treatment/Interventions: Ambulation/gait training;Balance/vestibular training;Community reintegration;Discharge planning;Neuromuscular re-education;Functional mobility training;DME/adaptive equipment instruction;Disease management/prevention;Pain management;Patient/family education;Psychosocial support;UE/LE Coordination activities;UE/LE Strength taining/ROM;Therapeutic Exercise;Therapeutic Activities;Wheelchair propulsion/positioning;Stair training PT Transfers Anticipated Outcome(s): Mod I with LRAD PT Locomotion Anticipated Outcome(s): No wheelchair goals set; Mod I ambulation and stairs PT Recommendation Follow Up Recommendations: Outpatient PT Patient destination: Home Equipment Recommended: None recommended by PT Equipment Details: Patient owns RW, no DME needed from PT  Skilled Therapeutic Intervention Skilled therapeutic intervention initiated after completion of evaluation. Patient performed gait training 200' x1 (second bout) with RW and supervision. Bed mobility with emphasis on LE management. Sit<>stands with emphasis on proper/safe  technique. Patient left supine in bed with all needs within reach.  PT Evaluation Precautions/Restrictions Precautions Precautions: Fall;Knee Precaution Comments: SLR without 10 degree lag on 02/06/13 bilaterally Required Braces or  Orthoses: Knee Immobilizer - Right Knee Immobilizer - Right: Discontinue once straight leg raise with < 10 degree lag Knee Immobilizer - Left: Discontinue once straight leg raise with < 10 degree lag Restrictions Weight Bearing Restrictions: No Other Position/Activity Restrictions: WBAT General Chart Reviewed: Yes Family/Caregiver Present: No  Pain Pain Assessment Pain Assessment: No/denies pain Pain Score: 0-No pain Home Living/Prior Functioning Home Living Living Arrangements: Spouse/significant other;Children Available Help at Discharge: Family;Available 24 hours/day;Other (Comment) (son at home for 1 month ) Type of Home: House Home Access: Stairs to enter;Other (comment) (RW fits on stairs) Entrance Stairs-Number of Steps: 7 Entrance Stairs-Rails: None Home Layout: Two level;Able to live on main level with bedroom/bathroom Alternate Level Stairs-Number of Steps: full flight but patient will not use 2nd level  Lives With: Spouse Prior Function Level of Independence: Independent with transfers;Independent with basic ADLs;Independent with gait  Able to Take Stairs?: Yes Driving: Yes Vocation: Full time employment Education officer, 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) Vision/Perception  Vision - History Baseline Vision: Wears glasses only for reading Visual History: Cataracts Patient Visual Report: No change from baseline Vision - Assessment Eye Alignment: Within Functional Limits  Cognition Overall Cognitive Status: Within Functional Limits for tasks assessed Orientation Level: Disoriented X4 Attention: Alternating Alternating Attention: Appears intact Memory: Appears intact Awareness: Appears intact Problem Solving: Appears  intact Safety/Judgment: Appears intact Sensation Sensation Light Touch: Appears Intact Hot/Cold: Appears Intact Proprioception: Appears Intact Additional Comments: Proprioception intact at B ankles Coordination Gross Motor Movements are Fluid and Coordinated: Yes Fine Motor Movements are Fluid and Coordinated: Yes Motor  Motor Motor: Within Functional Limits  Mobility Bed Mobility Bed Mobility: Supine to Sit;Sit to Supine Supine to Sit: 6: Modified independent (Device/Increase time);HOB flat Sit to Supine: 6: Modified independent (Device/Increase time);HOB flat Transfers Transfers: Yes Sit to Stand: 4: Min guard;With upper extremity assist;From chair/3-in-1;From bed Stand to Sit: 5: Supervision;To chair/3-in-1;To bed;With upper extremity assist Stand to Sit Details (indicate cue type and reason): Verbal cues for precautions/safety;Verbal cues for sequencing Stand Pivot Transfers: 5: Supervision Stand Pivot Transfer Details: Verbal cues for sequencing;Verbal cues for precautions/safety;Verbal cues for technique Locomotion  Ambulation Ambulation: Yes Ambulation/Gait Assistance: 5: Supervision Ambulation Distance (Feet): 200 Feet Assistive device: Rolling walker Ambulation/Gait Assistance Details: Verbal cues for gait pattern;Verbal cues for precautions/safety Ambulation/Gait Assistance Details: Patient performed gait training 200' x1 in controlled environment with RW and supervision. Requires verbal cues for upright posture, achieving heel strike Gait Gait: Yes Gait Pattern: Impaired Gait Pattern: Step-to pattern;Step-through pattern;Antalgic;Trunk flexed;Decreased hip/knee flexion - left;Decreased hip/knee flexion - right;Wide base of support;Decreased stride length Stairs / Additional Locomotion Stairs: Yes Stairs Assistance: 5: Supervision Stairs Assistance Details: Visual cues/gestures for sequencing;Verbal cues for sequencing;Verbal cues for technique;Verbal cues for  precautions/safety;Verbal cues for safe use of DME/AE Stairs Assistance Details (indicate cue type and reason): Patient negotiated 2 stairs without rails and with RW (curb-like) to simulate STE home. Patient has 7 steps that are deep enough to fit RW on. Stair Management Technique: No rails;Step to pattern;Forwards;With walker Number of Stairs: 2 Height of Stairs: 6 Curb: 5: Supervision (with RW, x2 trials) Wheelchair Mobility Wheelchair Mobility: No  Trunk/Postural Assessment  Cervical Assessment Cervical Assessment: Within Functional Limits Thoracic Assessment Thoracic Assessment: Within Functional Limits Lumbar Assessment Lumbar Assessment: Within Functional Limits Postural Control Postural Control: Within Functional Limits  Balance Balance Balance Assessed: Yes Static Standing Balance Static Standing - Balance Support: Bilateral upper extremity supported;During functional activity Static Standing - Level of Assistance: 5: Stand by  assistance Extremity Assessment  RLE Assessment RLE Assessment: Exceptions to [facility] RLE AROM (degrees) Right Knee Extension: 5 Right Knee Flexion: 72 RLE PROM (degrees) Right Knee Flexion: 75 RLE Strength RLE Overall Strength: Deficits;Due to pain RLE Overall Strength Comments: Grossly/functionally 3/5; formal MMT not tested secondary to pain LLE Assessment LLE Assessment: Exceptions to [facility] LLE AROM (degrees) Left Knee Extension: 6 Left Knee Flexion: 76 LLE PROM (degrees) Left Knee Flexion: 82 LLE Strength LLE Overall Strength: Deficits;Due to pain LLE Overall Strength Comments: Grossly/functionally 3/5; formal MMT not tested secondary to pain  FIM:  FIM - Banker Devices: Walker;Arm rests Bed/Chair Transfer: 5: Supine > Sit: Supervision (verbal cues/safety issues);5: Sit > Supine: Supervision (verbal cues/safety issues);4: Bed > Chair or W/C: Min A (steadying Pt. > 75%);5: Chair or W/C > Bed:  Supervision (verbal cues/safety issues) FIM - Locomotion: Wheelchair Locomotion: Wheelchair: 0: Activity did not occur FIM - Locomotion: Ambulation Locomotion: Ambulation Assistive Devices: Designer, industrial/product Ambulation/Gait Assistance: 5: Supervision Locomotion: Ambulation: 5: Travels 150 ft or more with supervision/safety issues FIM - Locomotion: Stairs Locomotion: Building control surveyor: Psychologist, educational: Stairs: 0: Activity did not occur   Refer to Care Plan for Long Term Goals  Recommendations for other services: None  Discharge Criteria: Patient will be discharged from PT if patient refuses treatment 3 consecutive times without medical reason, if treatment goals not met, if there is a change in medical status, if patient makes no progress towards goals or if patient is discharged from hospital.  The above assessment, treatment plan, treatment alternatives and goals were discussed and mutually agreed upon: by patient  Chipper Herb. Orlinda Slomski, PT, DPT 02/06/2013, 11:52 AM

## 2013-02-06 NOTE — Progress Notes (Signed)
Social Work Patient ID: [name] [name], male   DOB: 08/09/1950, 62 y.o.   MRN: [id number] [name] [name] following pt, will follow once discharged home for PT,RN.  Will follow up with once team conference and discharge date confirmed.

## 2013-02-06 NOTE — Progress Notes (Signed)
Occupational Therapy Session Note  Patient Details  Name: [name] [name] MRN: [id number] Date of Birth: 10/03/1950  Today's Date: 02/06/2013 Time: 0730-0830 Time Calculation (min): 60 min  Short Term Goals: Week 1:  OT Short Term Goal 1 (Week 1): STGs=LTGs  Skilled Therapeutic Interventions/Progress Updates:    Therapy session following evaluation focused on functional transfers and ADL retraining. Pt completed all transfers and self-care tasks with supervision on this date and increased time. Pt used RW for functional ambulation around room. Pt required min verbal cues for "walking legs out" during stand>sit. Pt very motivated and demonstrates good safety awareness. Pt left supine in bed with all needs in reach. Discussed home setup and responsibilities. Pt reported he will not need to complete any house duties during his recovery as his son will be home for the first month following discharge and is able to assist with yard work. Pt does not doing any cooking, cleaning, or laundry.  Therapy Documentation Precautions:  Restrictions Weight Bearing Restrictions: No General:   Vital Signs: Therapy Vitals Temp: 98 F (36.7 C) Temp src: Oral Pulse Rate: 81 Resp: 19 BP: 131/73 mmHg Patient Position, if appropriate: Lying Oxygen Therapy SpO2: 98 % O2 Device: None (Room air) Pain: Pt reported pain 3/10 in bil knees.   See FIM for current functional status  Therapy/Group: Individual Therapy  Daneil Dan 02/06/2013, 8:31 AM

## 2013-02-06 NOTE — Progress Notes (Signed)
Occupational Therapy Assessment and Plan  Patient Details  Name: [name] [name] MRN: [id number] Date of Birth: 09-Jul-1950  OT Diagnosis: muscle weakness (generalized) Rehab Potential: Rehab Potential: Excellent ELOS: 5-7 days   Today's Date: 02/06/2013  Problem List:  Patient Active Problem List   Diagnosis Date Noted  . Leucocytosis 02/05/2013  . Postoperative anemia due to acute blood loss 02/01/2013  . Hyponatremia 02/01/2013  . Postop Hypotension 02/01/2013  . OA (osteoarthritis) of bilateral knees 01/31/2013    Past Medical History:  Past Medical History  Diagnosis Date  . Restless leg syndrome   . Arthritis    Past Surgical History:  Past Surgical History  Procedure Laterality Date  . Right leg surgery       torn ligament and cartilage in 1972   . Torn retina       1995   . Cataract srugery       bilateral   . Left leg surgery     . Cholecystectomy    . Total knee arthroplasty Bilateral 01/31/2013    Procedure: TOTAL KNEE BILATERAL;  Surgeon: Loanne Drilling, MD;  Location: [facility];  Service: Orthopedics;  Laterality: Bilateral;    Assessment & Plan Clinical Impression: Patient is a 62 y.o. year old male with history of RLS, endstage OA bilateral knees who elected to undergo B-TKR on 01/31/13 by Dr. [name]. Post op WBAT and on coumadin/lovenox bridge for DVT prophylaxis. He did have hypotension after epidural bolus that responded well to fluid resuscitation. ABLA noted with hgb at 10.1. On epidural for pain management.  Patient transferred to CIR on 02/05/2013 .    Patient currently requires supervision with basic self-care skills secondary to muscle weakness and muscle joint tightness.  Prior to hospitalization, patient could complete BADLs with independent .  Patient will benefit from skilled intervention to increase independence with basic self-care skills prior to discharge home with care partner.  Anticipate patient will require no supervision  and no further OT  follow recommended.  OT - End of Session Endurance Deficit: No OT Assessment Rehab Potential: Excellent OT Patient demonstrates impairments in the following area(s): Motor;Balance;Pain;Safety OT Basic ADL's Functional Problem(s): Grooming;Bathing;Dressing;Toileting OT Transfers Functional Problem(s): Toilet;Tub/Shower OT Additional Impairment(s): None OT Plan OT Intensity: Minimum of 1-2 x/day, 45 to 90 minutes OT Frequency: 5 out of 7 days OT Duration/Estimated Length of Stay: 5-7 days OT Treatment/Interventions: Balance/vestibular training;Community reintegration;Discharge planning;DME/adaptive equipment instruction;Functional mobility training;Pain management;Psychosocial support;Patient/family education;Self Care/advanced ADL retraining;Therapeutic Activities;Therapeutic Exercise;UE/LE Strength taining/ROM;UE/LE Coordination activities OT Basic Self-Care Anticipated Outcome(s): Mod I OT Toileting Anticipated Outcome(s): Mod I  OT Bathroom Transfers Anticipated Outcome(s): Mod I  OT Recommendation Patient destination: Home Follow Up Recommendations: None Equipment Recommended: Tub/shower bench   OT Evaluation Precautions/Restrictions  Precautions Precautions: Fall;Knee Precaution Comments: SLR without 10 degree lag on 02/06/13 bilaterally Required Braces or Orthoses: Knee Immobilizer - Right Knee Immobilizer - Right: Discontinue once straight leg raise with < 10 degree lag Knee Immobilizer - Left: Discontinue once straight leg raise with < 10 degree lag Restrictions Weight Bearing Restrictions: No Other Position/Activity Restrictions: WBAT General   Vital Signs   Pain Pain Assessment Pain Assessment: No/denies pain Pain Score: 0-No pain Home Living/Prior Functioning Home Living Family/patient expects to be discharged to:: Private residence Living Arrangements: Spouse/significant other Available Help at Discharge: Family;Available 24 hours/day;Other (Comment) (son at  home for 1 month ) Type of Home: House Home Access: Stairs to enter;Other (comment) (RW fits on stairs) Entrance Stairs-Number of Steps: 7  Entrance Stairs-Rails: None Home Layout: Two level;Able to live on main level with bedroom/bathroom Alternate Level Stairs-Number of Steps: full flight but patient will not use 2nd level  Lives With: Spouse Prior Function Level of Independence: Independent with transfers;Independent with basic ADLs;Independent with gait  Able to Take Stairs?: Yes Driving: Yes Vocation: Full time employment Education officer, 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) ADL   Vision/Perception  Vision - History Baseline Vision: Wears glasses only for reading Visual History: Cataracts Patient Visual Report: No change from baseline Vision - Assessment Eye Alignment: Within Functional Limits  Cognition Overall Cognitive Status: Within Functional Limits for tasks assessed Orientation Level: Disoriented X4 Attention: Alternating Alternating Attention: Appears intact Memory: Appears intact Awareness: Appears intact Problem Solving: Appears intact Safety/Judgment: Appears intact Sensation Sensation Light Touch: Appears Intact Hot/Cold: Appears Intact Proprioception: Appears Intact Additional Comments: Proprioception intact at B ankles Coordination Gross Motor Movements are Fluid and Coordinated: Yes Fine Motor Movements are Fluid and Coordinated: Yes Motor  Motor Motor: Within Functional Limits Mobility     Trunk/Postural Assessment     Balance   Extremity/Trunk Assessment RUE Assessment RUE Assessment: Within Functional Limits LUE Assessment LUE Assessment: Within Functional Limits  FIM:  FIM - Eating Eating Activity: 7: Complete independence:no helper FIM - Grooming Grooming Steps: Shave or apply make-up;Brush, comb hair;Wash, rinse, dry face;Wash, rinse, dry hands;Oral care, brush teeth, clean dentures Grooming: 5: Set-up assist to obtain items FIM -  Bathing Bathing Steps Patient Completed: Front perineal area;Chest;Right lower leg (including foot);Left lower leg (including foot);Buttocks;Right Arm;Left Arm;Right upper leg;Left upper leg;Abdomen Bathing: 5: Supervision: Safety issues/verbal cues FIM - Upper Body Dressing/Undressing Upper body dressing/undressing steps patient completed: Thread/unthread right sleeve of pullover shirt/dresss;Thread/unthread left sleeve of pullover shirt/dress;Put head through opening of pull over shirt/dress;Pull shirt over trunk Upper body dressing/undressing: 5: Set-up assist to: Obtain clothing/put away FIM - Lower Body Dressing/Undressing Lower body dressing/undressing steps patient completed: Pull underwear up/down;Thread/unthread right underwear leg;Thread/unthread left underwear leg;Thread/unthread right pants leg;Pull pants up/down;Thread/unthread left pants leg;Don/Doff right sock;Don/Doff left sock Lower body dressing/undressing: 5: Set-up assist to: Don/Doff TED stocking FIM - Bed/Chair Transfer Bed/Chair Transfer: 5: Supine > Sit: Supervision (verbal cues/safety issues);5: Bed > Chair or W/C: Supervision (verbal cues/safety issues) FIM - Tub/Shower Transfers Tub/Shower Assistive Devices: Walk in shower;Tub transfer bench;Grab bars;Walker Tub/shower Transfers: 5-Into Tub/Shower: Supervision (verbal cues/safety issues);5-Out of Tub/Shower: Supervision (verbal cues/safety issues)   Refer to Care Plan for Long Term Goals  Recommendations for other services: None  Discharge Criteria: Patient will be discharged from OT if patient refuses treatment 3 consecutive times without medical reason, if treatment goals not met, if there is a change in medical status, if patient makes no progress towards goals or if patient is discharged from hospital.  The above assessment, treatment plan, treatment alternatives and goals were discussed and mutually agreed upon: by patient  Daneil Dan 02/06/2013, 10:48  AM

## 2013-02-06 NOTE — Progress Notes (Addendum)
Patient ID: [name] [name], male   DOB: Aug 15, 1950, 63 y.o.   MRN: [id number] Subjective/Complaints: [name] [name] is a 62 y.o. male with history of RLS, endstage OA bilateral knees who elected to undergo B-TKR on 01/31/13 by Dr. [name]. Post op WBAT and on coumadin/lovenox bridge for DVT prophylaxis. He did have hypotension after epidural bolus that responded well to fluid resuscitation. ABLA noted with hgb at 10.1. No problems overnite Review of Systems  Unable to perform ROS Musculoskeletal: Positive for joint pain.  All other systems reviewed and are negative.    Objective: Vital Signs: Blood pressure 131/73, pulse 81, temperature 98 F (36.7 C), temperature source Oral, resp. rate 19, SpO2 98.00%. No results found. Results for orders placed during the hospital encounter of 02/05/13 (from the past 72 hour(s))  URINALYSIS, ROUTINE W REFLEX MICROSCOPIC     Status: None   Collection Time    02/05/13  4:27 PM      Result Value Range   Color, Urine YELLOW  YELLOW   APPearance CLEAR  CLEAR   Specific Gravity, Urine 1.012  1.005 - 1.030   pH 6.5  5.0 - 8.0   Glucose, UA NEGATIVE  NEGATIVE mg/dL   Hgb urine dipstick NEGATIVE  NEGATIVE   Bilirubin Urine NEGATIVE  NEGATIVE   Ketones, ur NEGATIVE  NEGATIVE mg/dL   Protein, ur NEGATIVE  NEGATIVE mg/dL   Urobilinogen, UA 1.0  0.0 - 1.0 mg/dL   Nitrite NEGATIVE  NEGATIVE   Leukocytes, UA NEGATIVE  NEGATIVE   Comment: MICROSCOPIC NOT DONE ON URINES WITH NEGATIVE PROTEIN, BLOOD, LEUKOCYTES, NITRITE, OR GLUCOSE <1000 mg/dL.  CBC WITH DIFFERENTIAL     Status: Abnormal   Collection Time    02/06/13  6:00 AM      Result Value Range   WBC 6.9  4.0 - 10.5 K/uL   RBC 3.28 (*) 4.22 - 5.81 MIL/uL   Hemoglobin 9.7 (*) 13.0 - 17.0 g/dL   HCT 74.2 (*) 59.5 - 63.8 %   MCV 85.4  78.0 - 100.0 fL   MCH 29.6  26.0 - 34.0 pg   MCHC 34.6  30.0 - 36.0 g/dL   RDW 75.6  43.3 - 29.5 %   Platelets 209  150 - 400 K/uL   Neutrophils Relative % 56  43 - 77 %    Neutro Abs 3.9  1.7 - 7.7 K/uL   Lymphocytes Relative 23  12 - 46 %   Lymphs Abs 1.6  0.7 - 4.0 K/uL   Monocytes Relative 15 (*) 3 - 12 %   Monocytes Absolute 1.0  0.1 - 1.0 K/uL   Eosinophils Relative 6 (*) 0 - 5 %   Eosinophils Absolute 0.4  0.0 - 0.7 K/uL   Basophils Relative 0  0 - 1 %   Basophils Absolute 0.0  0.0 - 0.1 K/uL     HEENT: normal and AT/[place] Cardio: RRR and no murmurs Resp: CTA B/L and no respiratory distress GI: BS positive and non distended Extremity:  Pulses positive and No Edema Skin:   Wound C/D/I and bilateral knees Neuro: Alert/Oriented, Anxious, Normal Sensory, Abnormal Motor 5/5 in BUE, 3-/5 HF, KE 3-/5, 5/5 in Bilateral ankles and Normal [facility]MC Musc/Skel:  Other decreased Knee flex and ext bilateral Gen NAD   Assessment/Plan: 1. Functional deficits secondary to endstage OA Bilateral knee s/p TKR which require 3+ hours per day of interdisciplinary therapy in a comprehensive inpatient rehab setting. Physiatrist is providing close team supervision  and 24 hour management of active medical problems listed below. Physiatrist and rehab team continue to assess barriers to discharge/monitor patient progress toward functional and medical goals. FIM:                   Comprehension Comprehension Mode: Auditory Comprehension: 6-Follows complex conversation/direction: With extra time/assistive device  Expression Expression Mode: Verbal Expression: 6-Expresses complex ideas: With extra time/assistive device  Social Interaction Social Interaction: 6-Interacts appropriately with others with medication or extra time (anti-anxiety, antidepressant).       Medical Problem List and Plan:  1. DVT Prophylaxis/Anticoagulation: Pharmaceutical: Coumadin and Lovenox  2. Pain Management: Will add oxycontin for more consistent pain relief. Schedule robaxin qid.  3. Mood: Has a good outlook. Will have LCSW follow for evaluation.  4. Neuropsych: This patient is  capable of making decisions on his own behalf.  5. ABLA: Recheck lab in am. May need to add iron supplement  6. Reactive leucocytosis: Monitor wound and for fevers. Recheck CBC for follow up in am. Check ucs  7. Constipation: Will increase senna--sorbitol and enema today as he hasn't emptied bowels completely  8. Hesitancy: will check PVRs. Check UA/UCS. Encouraged patient to sit EOB with assist to help with voiding.    LOS (Days) 1 A FACE TO FACE EVALUATION WAS PERFORMED  KIRSTEINS,ANDREW E 02/06/2013, 6:47 AM

## 2013-02-06 NOTE — Progress Notes (Signed)
Patient information reviewed and entered into [facility] by Becky Rector [place]ons[place], covering PPS coordinator. Information including medical coding and functional independence measure will be reviewed and updated through discharge.   

## 2013-02-06 NOTE — Progress Notes (Signed)
Social Work Assessment and Plan Social Work Assessment and Plan  Patient Details  Name: [name] [name] MRN: [id number] Date of Birth: 1950/11/03  Today's Date: 02/06/2013  Problem List:  Patient Active Problem List   Diagnosis Date Noted  . Leucocytosis 02/05/2013  . Postoperative anemia due to acute blood loss 02/01/2013  . Hyponatremia 02/01/2013  . Postop Hypotension 02/01/2013  . OA (osteoarthritis) of bilateral knees 01/31/2013   Past Medical History:  Past Medical History  Diagnosis Date  . Restless leg syndrome   . Arthritis    Past Surgical History:  Past Surgical History  Procedure Laterality Date  . Right leg surgery       torn ligament and cartilage in 1972   . Torn retina       1995   . Cataract srugery       bilateral   . Left leg surgery     . Cholecystectomy    . Total knee arthroplasty Bilateral 01/31/2013    Procedure: TOTAL KNEE BILATERAL;  Surgeon: Loanne Drilling, MD;  Location: [facility];  Service: Orthopedics;  Laterality: Bilateral;   Social History:  reports that he has never smoked. He has never used smokeless tobacco. He reports that  drinks alcohol. He reports that he does not use illicit drugs.  Family / Support Systems Marital Status: Married Patient Roles: Spouse;Parent;Other (Comment) (Employee) Spouse/Significant Other: [name]- [phone]  [phone] Children: [name]-son  [phone] Other Supports: Friends Anticipated Caregiver: Son during th day and wife at night Ability/Limitations of Caregiver: No limitations-son home for a month  Caregiver Availability: 24/7 Family Dynamics: Close knit family who are willing to assist one another.  Son is home for a month and can stay with pt unitl he recovers.  Wife works during the day.  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  He is glad he went ahead and did both knees so it is done.  He is aware he will hurt some and is willing to work through his pain. Recent Psychosocial Issues: Healthy PTA Pyschiatric History: No history-deferred depression screen due to pt doing well with his rehab. Substance Abuse History: No issues  Patient / Family Perceptions, Expectations & Goals Pt/Family understanding of illness & functional limitations: Pt and wife are able to explain his surgery and recovery process.  Both are pleased with how well he is doing so far and hopeful the rest of his recovery will go as well.  Will have someone at home with him, if needed. Premorbid pt/family roles/activities: Husband, Father, Network engineer, Church member, etc Anticipated changes in roles/activities/participation: resume Pt/family expectations/goals: Pt states: " I just want to be able to get around on my own."  Wife states: " I hope he does well and will help with what I can."  [employer]: None Premorbid Home Care/DME Agencies: Other (Comment) ([facility]-active pt) Transportation available at discharge: Wife and son  Discharge Planning Living Arrangements: Spouse/significant other;Children Support Systems: Spouse/significant other;Children;Friends/neighbors;Church/faith community Type of Residence: Private  residence Civil engineer, contracting: Media planner (specify) Herbalist) Financial Resources: Employment;Family Support Financial Screen Referred:  No Living Expenses: Lives with family Money Management: Spouse;Patient Does the patient have any problems obtaining your medications?: No Home Management: Wife and he does the outside work Patient/Family Preliminary Plans: Return home with wife and son who is home for a month to assist.  Wife works during the day as a Runner, broadcasting/film/video.  Pt is doing well and will be a short length of stay. Social Work Anticipated Follow Up Needs: HH/OP  Clinical Impression Pleasant motivated gentleman who is moving well and has good family support.  Will be a short length of stay.  Lucy Chris 02/06/2013, 11:32 AM

## 2013-02-06 NOTE — Care Management Note (Signed)
[facility] Individual Statement of Services  Patient Name:  [name] [name]  Date:  02/06/2013  Welcome to the [facility]patient [facility].  Our goal is to provide you with an individualized program based on your diagnosis and situation, designed to meet your specific needs.  With this comprehensive rehabilitation program, you will be expected to participate in at least 3 hours of rehabilitation therapies Monday-Friday, with modified therapy programming on the weekends.  Your rehabilitation program will include the following services:  Physical Therapy (PT), Occupational Therapy (OT), 24 hour per day rehabilitation nursing, Case Management (Social Worker), Rehabilitation Medicine, Nutrition Services and Pharmacy Services  Weekly team conferences will be held on Wednesday to discuss your progress.  Your Social Worker will talk with you frequently to get your input and to update you on team discussions.  Team conferences with you and your family in attendance may also be held.  Expected length of stay: 5-7 days Overall anticipated outcome: mod/i level  Depending on your progress and recovery, your program may change. Your Social Worker will coordinate services and will keep you informed of any changes. Your Social Worker's name and contact numbers are listed  below.  The following services may also be recommended but are not provided by the [facility]patient [facility]:   Driving Evaluations  Home Health Rehabiltiation Services  Outpatient Rehabilitation Services    Arrangements will be made to provide these services after discharge if needed.  Arrangements include referral to agencies that provide these services.  Your insurance has been verified to be:  BCBS Your primary doctor is:    Pertinent information will be shared with your doctor and your insurance company.  Social Worker:  Dossie Der, SW [phone] or [phone]  Information discussed with and  copy given to patient by: Lucy Chris, 02/06/2013, 11:02 AM

## 2013-02-06 NOTE — Progress Notes (Signed)
Physical Therapy Session Note  Patient Details  Name: [name] [name] MRN: [id number] Date of Birth: 1951/04/03  Today's Date: 02/06/2013 Time: 1330-1415 Time Calculation (min): 45 min  Short Term Goals: Week 1:  PT Short Term Goal 1 (Week 1): STGs=LTGs due to short ELOS  Skilled Therapeutic Interventions/Progress Updates:    Patient received supine in bed. Session focused on gait training, stair negotiation, car transfer, and B LE strengthening and B knee ROM. See details below for gait training and stair negotiation. Patient completed car transfer to midsize SUV height with RW and supervision (patient will go home in [facility]). Patient instructed in supine LE strengthening and B knee ROM exercises: quad sets with 5" hold x10 reps, calf stretch with sheet 2x30" hold, heel slides with sheet x10 reps. Patient returned to room and left supine in bed with all needs within reach; ice packs donned to B knees.  Therapy Documentation Precautions:  Precautions Precautions: Fall;Knee Precaution Comments: SLR without 10 degree lag on 02/06/13 bilaterally Required Braces or Orthoses: Knee Immobilizer - Right Knee Immobilizer - Right: Discontinue once straight leg raise with < 10 degree lag Knee Immobilizer - Left: Discontinue once straight leg raise with < 10 degree lag Restrictions Weight Bearing Restrictions: No Other Position/Activity Restrictions: WBAT Pain: Pain Assessment Pain Assessment: No/denies pain Pain Score: 0-No pain Locomotion : Ambulation Ambulation: Yes Ambulation/Gait Assistance: 5: Supervision Ambulation Distance (Feet): 160 Feet Assistive device: Rolling walker Ambulation/Gait Assistance Details: Verbal cues for gait pattern;Verbal cues for precautions/safety Ambulation/Gait Assistance Details: Patient performed gait training 160' x2 in controlled environment with RW and supervision. Requires verbal cues for upright posture and achieving heel strike. Gait Gait:  Yes Gait Pattern: Impaired Gait Pattern: Step-to pattern;Step-through pattern;Antalgic;Trunk flexed;Decreased hip/knee flexion - left;Decreased hip/knee flexion - right;Wide base of support;Decreased stride length Stairs / Additional Locomotion Stairs: Yes Stairs Assistance: 5: Supervision Stairs Assistance Details: Verbal cues for precautions/safety;Verbal cues for safe use of DME/AE Stairs Assistance Details (indicate cue type and reason): Patient negotiated 3 stairs without rails and with RW (curb-like) to simulate STE home. Patient has 7 steps that are deep enough to fit RW on. Stair Management Technique: No rails;Step to pattern;Forwards;With walker Number of Stairs: 3 Height of Stairs: 6 Curb: 5: Supervision (with RW, x3 trials) Wheelchair Mobility Wheelchair Mobility: No    See FIM for current functional status  Therapy/Group: Individual Therapy  Chipper Herb. Jacksen Isip, PT, DPT 02/06/2013, 2:57 PM

## 2013-02-07 ENCOUNTER — Inpatient Hospital Stay (HOSPITAL_COMMUNITY): Payer: BC Managed Care – PPO | Admitting: *Deleted

## 2013-02-07 ENCOUNTER — Inpatient Hospital Stay (HOSPITAL_COMMUNITY): Payer: BC Managed Care – PPO

## 2013-02-07 LAB — PROTIME-INR: INR: 1.42 (ref 0.00–1.49)

## 2013-02-07 MED ORDER — POLYETHYLENE GLYCOL 3350 17 G PO PACK
17.0000 g | PACK | Freq: Two times a day (BID) | ORAL | Status: DC
  Administered 2013-02-07 – 2013-02-09 (×4): 17 g via ORAL
  Filled 2013-02-07 (×6): qty 1

## 2013-02-07 MED ORDER — WARFARIN SODIUM 10 MG PO TABS
10.0000 mg | ORAL_TABLET | Freq: Once | ORAL | Status: AC
  Administered 2013-02-07: 10 mg via ORAL
  Filled 2013-02-07: qty 1

## 2013-02-07 MED ORDER — POLYETHYLENE GLYCOL 3350 17 G PO PACK
17.0000 g | PACK | Freq: Once | ORAL | Status: AC
  Administered 2013-02-07: 17 g via ORAL
  Filled 2013-02-07: qty 1

## 2013-02-07 NOTE — Progress Notes (Signed)
Social Work Patient ID: [name] [name], male   DOB: 1950/06/08, 62 y.o.   MRN: [id number] Met with pt to inform team conference goals-mod/i level and discharge 9/19.  Discussing if needs a rolling walker and tub bench. His son to come in later and can attend therapies with him.  Agreeable to discharge date and pleased with his progress.

## 2013-02-07 NOTE — Progress Notes (Addendum)
ANTICOAGULATION CONSULT NOTE - Follow Up Consult  Pharmacy Consult for Coumadin Indication: VTE prophylaxis  Allergies  Allergen Reactions  . Penicillins     REACTION: swelling    Patient Measurements:   Heparin Dosing Weight:   Vital Signs: Temp: 97.9 F (36.6 C) (09/17 0557) Temp src: Oral (09/17 0557) BP: 137/77 mmHg (09/17 0557) Pulse Rate: 101 (09/17 0557)  Labs:  Recent Labs  02/05/13 0415 02/06/13 0600 02/06/13 1425  HGB  --  9.7*  --   HCT  --  28.0*  --   PLT  --  209  --   LABPROT 14.5  --  17.0*  INR 1.15  --  1.42  CREATININE  --  0.91  --     The CrCl is unknown because both a height and weight (above a minimum accepted value) are required for this calculation.   Medications:  Scheduled:  . enoxaparin (LOVENOX) injection  30 mg Subcutaneous Q12H  . iron polysaccharides  150 mg Oral BID AC  . methocarbamol  500 mg Oral QID  . OxyCODONE  10 mg Oral Q12H  . senna-docusate  1 tablet Oral BID  . Warfarin - Pharmacist Dosing Inpatient   Does not apply q1800    Assessment: 61yo male on Coumadin for VTE px, planned duration 4 weeks.  INR 1.42 yesterday afternoon, drawn late as had not been ordered & then I failed to follow up on result and therefore no Coumadin dose.  INR has not been drawn yet this AM.  No bleeding problems noted.  INR may not be down today, but expect so 9/18 so will repeat previous larger dose.  Pt is is also receiving Lovenox 30mg  SQ q12.  Goal of Therapy:  INR 2-3 Monitor platelets by anticoagulation protocol: Yes   Plan:  1.  Coumadin 10mg  today 2.  Continue daily INR  Marisue Humble, PharmD Clinical Pharmacist [facility]

## 2013-02-07 NOTE — Progress Notes (Signed)
Occupational Therapy Session Note  Patient Details  Name: [name] [name] MRN: [id number] Date of Birth: [birth year]  Today's Date: 02/07/2013 Time: 0730-0830 and 4098-1191 Time Calculation (min): 60 min and 27 min   Short Term Goals: Week 1:  OT Short Term Goal 1 (Week 1): STGs=LTGs  Skilled Therapeutic Interventions/Progress Updates:    Session 1:Therapy session on ADL retraining focused on functional ambulation, functional transfers, and activity tolerance. Pt completed all self-care tasks at Mod I level on this date and made Mod I in controlled environment/room. Pt demonstrated good safety when reaching into drawers to retrieve clothing items and problem solved without cues on safest way to transport items to ADL apartment tub shower. Completed tub transfer using TTB at Mod I level and pt agrees to having TTB at home for safety and to allow him to complete transfers and bathing without assist. Pt left in room with all items in reach.   Session 2: Therapy session focused on functional endurance, dynamic balance, sit<>stand in community environment. Pt ambulated in crowded environment and outdoors with uneven surface with no lob. Pt required 1 rest break throughout therapy session and able to located nearest chair in environment. Educated on always being aware of this when in the environment. Discussed healthy lifestyle habits and pt reporting wanting to get back into exercising more. Pt returned to room at end of therapy session.   Therapy Documentation Precautions:  Precautions Precautions: Fall;Knee Precaution Comments: SLR without 10 degree lag on 02/06/13 bilaterally Required Braces or Orthoses: Knee Immobilizer - Right Knee Immobilizer - Right: Discontinue once straight leg raise with < 10 degree lag Knee Immobilizer - Left: Discontinue once straight leg raise with < 10 degree lag Restrictions Weight Bearing Restrictions: No Other Position/Activity Restrictions: WBAT General:    Vital Signs: Therapy Vitals Temp: 97.9 F (36.6 C) Temp src: Oral Pulse Rate: 101 Resp: 20 BP: 137/77 mmHg Patient Position, if appropriate: Lying Oxygen Therapy SpO2: 94 % O2 Device: None (Room air) Pain: Session 1: Pt reported "little" pain in bil knees.  Session 2: Reports 3/10 pain in bil knees.   See FIM for current functional status  Therapy/Group: Individual Therapy  Daneil Dan 02/07/2013, 8:25 AM

## 2013-02-07 NOTE — Progress Notes (Signed)
Physical Therapy Session Note  Patient Details  Name: [name] [name] MRN: [id number] Date of Birth: [birth year]  Today's Date: 02/07/2013 Time: 1115-1200 and 1400-1500 Time Calculation (min): 45 min and 60 min  Short Term Goals: Week 1:  PT Short Term Goal 1 (Week 1): STGs=LTGs due to short ELOS  Skilled Therapeutic Interventions/Progress Updates:    AM Session: Patient received supine in bed. Session focused on functional transfers, gait training, B knee ROM, and B LE strengthening. See details below for gait and stairs. Patient exercised on NuStep Level 4 x10' with B UE and B LE to perform self-assisted B knee ROM. Patient with intermittent 10" holds in flexion. Patient performed x20 reps standing heel raises in // bars with supervision. Patient with c/o light headedness and requires seated rest break. Light headedness  Subsides within 10". Patient performed x10 mini squats in // bars with supervision. Patient returned to room and left seated edge of bed with all needs within reach. Patient made mod I in controlled environment of room.  PM Session: Patient received supine in bed. Session focused on gait training in community environment and on uneven surfaces, see details below. Patient performed x10 L LE step ups on 7" step with B UE support and x5 on R LE (could not tolerate full 10 reps) to facilitate improved quad strength and B knee ROM. Patient returned to room and left supine in bed with all needs within reach.  Therapy Documentation Precautions:  Precautions Precautions: Fall;Knee Precaution Comments: SLR without 10 degree lag on 02/06/13 bilaterally Required Braces or Orthoses: Knee Immobilizer - Right Knee Immobilizer - Right: Discontinue once straight leg raise with < 10 degree lag Knee Immobilizer - Left: Discontinue once straight leg raise with < 10 degree lag Restrictions Weight Bearing Restrictions: No Other Position/Activity Restrictions: WBAT Pain: Pain Assessment Pain  Assessment: No/denies pain Pain Score: 0-No pain Locomotion : Ambulation Ambulation: Yes Ambulation/Gait Assistance: 6: Modified independent (Device/Increase time) Ambulation Distance (Feet): 300 Feet Assistive device: Rolling walker Ambulation/Gait Assistance Details: Patient performed several bouts of gait training 150+ feet and >300' x3 in controlled, home, and community environments (in hospital hallways, in room/confined spaces, in hospital lobby, outside on uneven surfaces; brick, concrete, grass, inclines, declines) with RW and mod I. Patient with one instance of R foot catching on rug, but no overt LOB and patient able to self-correct without assistance. Gait Gait: Yes Gait Pattern: Impaired Gait Pattern: Step-through pattern;Antalgic;Trunk flexed;Decreased hip/knee flexion - left;Decreased hip/knee flexion - right;Wide base of support;Decreased stride length Stairs / Additional Locomotion Stairs: Yes Stairs Assistance: 5: Supervision Stairs Assistance Details: Verbal cues for precautions/safety;Verbal cues for safe use of DME/AE Stairs Assistance Details (indicate cue type and reason): Patient negotiated 7 stairs without rails with RW (curb-like) to simulate STE home. Patient's steps are deep enough to fit RW. Stair Management Technique: No rails;Step to pattern;Forwards;With walker Number of Stairs: 7 Height of Stairs: 6 Curb: 5: Supervision (with RW, x7 trials) Wheelchair Mobility Wheelchair Mobility: No   See FIM for current functional status  Therapy/Group: Individual Therapy  Chipper Herb. Jennetta Flood, PT, DPT 02/07/2013, 3:23 PM

## 2013-02-07 NOTE — Progress Notes (Signed)
Patient ID: [name] [name], male   DOB: 03-Jan-1951, 62 y.o.   MRN: [id number] Subjective/Complaints: EDER MACEK is a 62 y.o. male with history of RLS, endstage OA bilateral knees who elected to undergo B-TKR on 01/31/13 by Dr. [name]. Post op WBAT and on coumadin/lovenox bridge for DVT prophylaxis. He did have hypotension after epidural bolus that responded well to fluid resuscitation. ABLA noted with hgb at 10.1. No problems overnite Constipated needed enema this am Mod I in rm Review of Systems  Unable to perform ROS Musculoskeletal: Positive for joint pain.  All other systems reviewed and are negative.    Objective: Vital Signs: Blood pressure 137/77, pulse 101, temperature 97.9 F (36.6 C), temperature source Oral, resp. rate 20, SpO2 94.00%. No results found. Results for orders placed during the hospital encounter of 02/05/13 (from the past 72 hour(s))  URINALYSIS, ROUTINE W REFLEX MICROSCOPIC     Status: None   Collection Time    02/05/13  4:27 PM      Result Value Range   Color, Urine YELLOW  YELLOW   APPearance CLEAR  CLEAR   Specific Gravity, Urine 1.012  1.005 - 1.030   pH 6.5  5.0 - 8.0   Glucose, UA NEGATIVE  NEGATIVE mg/dL   Hgb urine dipstick NEGATIVE  NEGATIVE   Bilirubin Urine NEGATIVE  NEGATIVE   Ketones, ur NEGATIVE  NEGATIVE mg/dL   Protein, ur NEGATIVE  NEGATIVE mg/dL   Urobilinogen, UA 1.0  0.0 - 1.0 mg/dL   Nitrite NEGATIVE  NEGATIVE   Leukocytes, UA NEGATIVE  NEGATIVE   Comment: MICROSCOPIC NOT DONE ON URINES WITH NEGATIVE PROTEIN, BLOOD, LEUKOCYTES, NITRITE, OR GLUCOSE <1000 mg/dL.  URINE CULTURE     Status: None   Collection Time    02/05/13  4:27 PM      Result Value Range   Specimen Description URINE, CLEAN CATCH     Special Requests NONE     Culture  Setup Time       Value: 02/05/2013 18:34     Performed at [facility] Count       Value: NO GROWTH     Performed at [facility]   Culture       Value: NO GROWTH      Performed at [facility]   Report Status 02/06/2013 FINAL    COMPREHENSIVE METABOLIC PANEL     Status: Abnormal   Collection Time    02/06/13  6:00 AM      Result Value Range   Sodium 136  135 - 145 mEq/L   Potassium 3.5  3.5 - 5.1 mEq/L   Chloride 99  96 - 112 mEq/L   CO2 27  19 - 32 mEq/L   Glucose, Bld 108 (*) 70 - 99 mg/dL   BUN 15  6 - 23 mg/dL   Creatinine, Ser 4.09  0.50 - 1.35 mg/dL   Calcium 8.6  8.4 - 81.1 mg/dL   Total Protein 5.7 (*) 6.0 - 8.3 g/dL   Albumin 2.4 (*) 3.5 - 5.2 g/dL   AST 21  0 - 37 U/L   ALT 23  0 - 53 U/L   Alkaline Phosphatase 56  39 - 117 U/L   Total Bilirubin 0.8  0.3 - 1.2 mg/dL   GFR calc non Af Amer 88 (*) >90 mL/min   GFR calc Af Amer >90  >90 mL/min   Comment: (NOTE)     The eGFR  has been calculated using the CKD EPI equation.     This calculation has not been validated in all clinical situations.     eGFR's persistently <90 mL/min signify possible Chronic Kidney     Disease.  CBC WITH DIFFERENTIAL     Status: Abnormal   Collection Time    02/06/13  6:00 AM      Result Value Range   WBC 6.9  4.0 - 10.5 K/uL   RBC 3.28 (*) 4.22 - 5.81 MIL/uL   Hemoglobin 9.7 (*) 13.0 - 17.0 g/dL   HCT 09.8 (*) 11.9 - 14.7 %   MCV 85.4  78.0 - 100.0 fL   MCH 29.6  26.0 - 34.0 pg   MCHC 34.6  30.0 - 36.0 g/dL   RDW 82.9  56.2 - 13.0 %   Platelets 209  150 - 400 K/uL   Neutrophils Relative % 56  43 - 77 %   Neutro Abs 3.9  1.7 - 7.7 K/uL   Lymphocytes Relative 23  12 - 46 %   Lymphs Abs 1.6  0.7 - 4.0 K/uL   Monocytes Relative 15 (*) 3 - 12 %   Monocytes Absolute 1.0  0.1 - 1.0 K/uL   Eosinophils Relative 6 (*) 0 - 5 %   Eosinophils Absolute 0.4  0.0 - 0.7 K/uL   Basophils Relative 0  0 - 1 %   Basophils Absolute 0.0  0.0 - 0.1 K/uL  PROTIME-INR     Status: Abnormal   Collection Time    02/06/13  2:25 PM      Result Value Range   Prothrombin Time 17.0 (*) 11.6 - 15.2 seconds   INR 1.42  0.00 - 1.49     HEENT: normal and  AT/[place] Cardio: RRR and no murmurs Resp: CTA B/L and no respiratory distress GI: BS positive and non distended Extremity:  Pulses positive and No Edema Skin:   Wound C/D/I and bilateral knees Neuro: Alert/Oriented, Anxious, Normal Sensory, Abnormal Motor 5/5 in BUE, 3-/5 HF, KE 3-/5, 5/5 in Bilateral ankles and Normal FMC Musc/Skel:  Other decreased Knee flex and ext bilateral Gen NAD   Assessment/Plan: 1. Functional deficits secondary to endstage OA Bilateral knee s/p TKR which require 3+ hours per day of interdisciplinary therapy in a comprehensive inpatient rehab setting. Physiatrist is providing close team supervision and 24 hour management of active medical problems listed below. Physiatrist and rehab team continue to assess barriers to discharge/monitor patient progress toward functional and medical goals. FIM: FIM - Bathing Bathing Steps Patient Completed: Front perineal area;Chest;Right lower leg (including foot);Left lower leg (including foot);Buttocks;Right Arm;Left Arm;Right upper leg;Left upper leg;Abdomen Bathing: 5: Supervision: Safety issues/verbal cues  FIM - Upper Body Dressing/Undressing Upper body dressing/undressing steps patient completed: Thread/unthread right sleeve of pullover shirt/dresss;Thread/unthread left sleeve of pullover shirt/dress;Put head through opening of pull over shirt/dress;Pull shirt over trunk Upper body dressing/undressing: 5: Set-up assist to: Obtain clothing/put away FIM - Lower Body Dressing/Undressing Lower body dressing/undressing steps patient completed: Pull underwear up/down;Thread/unthread right underwear leg;Thread/unthread left underwear leg;Thread/unthread right pants leg;Pull pants up/down;Thread/unthread left pants leg;Don/Doff right sock;Don/Doff left sock Lower body dressing/undressing: 5: Set-up assist to: Don/Doff TED stocking        FIM - Banker Devices: Walker;Arm rests Bed/Chair  Transfer: 6: Supine > Sit: No assist;6: Sit > Supine: No assist;4: Bed > Chair or W/C: Min A (steadying Pt. > 75%);4: Chair or W/C > Bed: Min A (steadying Pt. > 75%)  FIM - Locomotion: Wheelchair Locomotion: Wheelchair: 0: Activity did not occur FIM - Locomotion: Ambulation Locomotion: Ambulation Assistive Devices: Designer, industrial/product Ambulation/Gait Assistance: 5: Supervision Locomotion: Ambulation: 5: Travels 150 ft or more with supervision/safety issues  Comprehension Comprehension Mode: Auditory Comprehension: 7-Follows complex conversation/direction: With no assist  Expression Expression Mode: Verbal Expression: 7-Expresses complex ideas: With no assist  Social Interaction Social Interaction: 7-Interacts appropriately with others - No medications needed.  Problem Solving Problem Solving: 7-Solves complex problems: Recognizes & self-corrects  Memory Memory: 7-Complete Independence: No helper Medical Problem List and Plan:  1. DVT Prophylaxis/Anticoagulation: Pharmaceutical: Coumadin and Lovenox  2. Pain Management: Will add oxycontin for more consistent pain relief. Schedule robaxin qid.  3. Mood: Has a good outlook. Will have LCSW follow for evaluation.  4. Neuropsych: This patient is capable of making decisions on his own behalf.  5. ABLA: Recheck lab in am. May need to add iron supplement  6. Reactive leucocytosis: Monitor wound and for fevers. Recheck CBC for follow up in am. Check ucs  7. Constipation: Will increase senna-- enema today with good results 8. Hesitancy: will check PVRs. Negative UA/UCS. LOS (Days) 2 A FACE TO FACE EVALUATION WAS PERFORMED  Reign Bartnick E 02/07/2013, 7:46 AM

## 2013-02-07 NOTE — Patient Care Conference (Signed)
Inpatient RehabilitationTeam Conference and Plan of Care Update Date: 02/07/2013   Time: 11:30 AM    Patient Name: [name] [name]      Medical Record Number: [id number]  Date of Birth: 1950/07/10 Sex: Male         Room/Bed: 4M05C/4M05C-01 Payor Info: Payor: BLUE CROSS BLUE [employer]IELD / Plan: BCBS [place] PPO / Product Type: *No Product type* /    Admitting Diagnosis: B TKR  Admit Date/Time:  02/05/2013  1:55 PM Admission Comments: No comment available   Primary Diagnosis:  OA (osteoarthritis) of knee Principal Problem: OA (osteoarthritis) of knee  Patient Active Problem List   Diagnosis Date Noted  . Leucocytosis 02/05/2013  . Postoperative anemia due to acute blood loss 02/01/2013  . Hyponatremia 02/01/2013  . Postop Hypotension 02/01/2013  . OA (osteoarthritis) of bilateral knees 01/31/2013    Expected Discharge Date: Expected Discharge Date: 02/09/13  Team Members Present: Physician leading conference: Dr. Claudette Laws Social Worker Present: Dossie Der, LCSW;Jenny Prevatt, LCSW Nurse Present: Other (comment) Larey Brick) PT Present: Bridgett Ripa, Lillie Columbia, PT OT Present: Scherrie November, OT;Kris Jacklynn Lewis, OT SLP Present: Feliberto Gottron, SLP PPS Coordinator present : Edson Snowball, PT     Current Status/Progress Goal Weekly Team Focus  Medical   post op anemia, stable, constipation, subtherapeutic on warfarin  [place] warfarin  pharmacy protocol   Bowel/Bladder   Continent of B&B; LBM=02/06/13  remain continent of B&B  remain continent of B&B   Swallow/Nutrition/ Hydration     [facility]        ADL's   supervision overall; good safety awareness  Mod I  strengthening, dynamic standing balance, functional transfers, education   Mobility   Supervision all mobility; intermittent min guard for sit>stand  Mod I all mobility; supervision car transfer  safety, gait, stair negotiation, transfers, bed mobility, strengthening, B knee ROM   Communication     [facility]         Safety/Cognition/ Behavioral Observations    WFL        Pain   denies pain   pain < 3  assess and monitor pain qshift and prn   Skin   Bilateral knees with SS; CDI; no S/S of infection/breakdown   skin will remain free from infection/breakdown  monitor and assess skin qshift and prn      *See Care Plan and progress notes for long and short-term goals.  Barriers to Discharge: see above, assess steps    Possible Resolutions to Barriers:  should be ready for D/C soon    Discharge Planning/Teaching Needs:  HOme with wife and  son who can provide 24 hr care-short LOS      Team Discussion:  Doing well in therapies-needs to work more on steps-7 steps to get in.  Son to come in for education  Revisions to Treatment Plan:  None   Continued Need for Acute Rehabilitation Level of Care: The patient requires daily medical management by a physician with specialized training in physical medicine and rehabilitation for the following conditions: Daily direction of a multidisciplinary physical rehabilitation program to ensure safe treatment while eliciting the highest outcome that is of practical value to the patient.: Yes Daily medical management of patient stability for increased activity during participation in an intensive rehabilitation regime.: Yes Daily analysis of laboratory values and/or radiology reports with any subsequent need for medication adjustment of medical intervention for : Post surgical problems  [name], [name] 02/07/2013, 2:29 PM

## 2013-02-08 ENCOUNTER — Inpatient Hospital Stay (HOSPITAL_COMMUNITY): Payer: BC Managed Care – PPO

## 2013-02-08 ENCOUNTER — Inpatient Hospital Stay (HOSPITAL_COMMUNITY): Payer: BC Managed Care – PPO | Admitting: *Deleted

## 2013-02-08 ENCOUNTER — Inpatient Hospital Stay (HOSPITAL_COMMUNITY): Payer: BC Managed Care – PPO | Admitting: Rehabilitation

## 2013-02-08 DIAGNOSIS — M1710 Unilateral primary osteoarthritis, unspecified knee: Secondary | ICD-10-CM

## 2013-02-08 DIAGNOSIS — Z96659 Presence of unspecified artificial knee joint: Secondary | ICD-10-CM

## 2013-02-08 DIAGNOSIS — D62 Acute posthemorrhagic anemia: Secondary | ICD-10-CM

## 2013-02-08 LAB — PROTIME-INR: Prothrombin Time: 17.6 seconds — ABNORMAL HIGH (ref 11.6–15.2)

## 2013-02-08 LAB — CBC
Hemoglobin: 9.8 g/dL — ABNORMAL LOW (ref 13.0–17.0)
MCH: 29.3 pg (ref 26.0–34.0)
Platelets: 274 10*3/uL (ref 150–400)
RBC: 3.35 MIL/uL — ABNORMAL LOW (ref 4.22–5.81)
WBC: 8.5 10*3/uL (ref 4.0–10.5)

## 2013-02-08 MED ORDER — POLYETHYLENE GLYCOL 3350 17 G PO PACK
17.0000 g | PACK | Freq: Once | ORAL | Status: AC
  Administered 2013-02-08: 17 g via ORAL
  Filled 2013-02-08: qty 1

## 2013-02-08 MED ORDER — WARFARIN SODIUM 10 MG PO TABS
10.0000 mg | ORAL_TABLET | Freq: Once | ORAL | Status: AC
  Administered 2013-02-08: 10 mg via ORAL
  Filled 2013-02-08: qty 1

## 2013-02-08 MED ORDER — FLEET ENEMA 7-19 GM/118ML RE ENEM
1.0000 | ENEMA | Freq: Every day | RECTAL | Status: DC | PRN
  Filled 2013-02-08: qty 1

## 2013-02-08 MED ORDER — SENNOSIDES-DOCUSATE SODIUM 8.6-50 MG PO TABS
2.0000 | ORAL_TABLET | Freq: Two times a day (BID) | ORAL | Status: DC
  Administered 2013-02-08 – 2013-02-09 (×3): 2 via ORAL
  Filled 2013-02-08 (×4): qty 2

## 2013-02-08 NOTE — Progress Notes (Signed)
Occupational Therapy Session Note  Patient Details  Name: [name] [name] MRN: [id number] Date of Birth: 05-18-1951  Today's Date: 02/08/2013 Time: 0700-0745 Time Calculation (min): 45 min  Short Term Goals: Week 1:  OT Short Term Goal 1 (Week 1): STGs=LTGs  Skilled Therapeutic Interventions/Progress Updates:   Therapy session focused on dynamic standing balance, functional mobility, and activity tolerance during ADL retraining. Pt gathered all items via reaching into low drawers and ambulated to ADL apartment with good safety while transporting items at Mod I level. Completed all self-care tasks and functional transfers at Mod I level. Pt required long rest break after bathing and dressing before ambulating back to room. Pt reported feeling very tired as he did not sleep well last night and requesting to eat breakfast to gain more strength for therapies on this date. Pt left in room where he is Mod I in controlled environment.  Therapy Documentation Precautions:  Precautions Precautions: Fall;Knee Precaution Comments: SLR without 10 degree lag on 02/06/13 bilaterally Required Braces or Orthoses: Knee Immobilizer - Right Knee Immobilizer - Right: Discontinue once straight leg raise with < 10 degree lag Knee Immobilizer - Left: Discontinue once straight leg raise with < 10 degree lag Restrictions Weight Bearing Restrictions: No Other Position/Activity Restrictions: WBAT General: General Amount of Missed OT Time (min): 15 Minutes Vital Signs: Therapy Vitals Temp: 97.2 F (36.2 C) Temp src: Oral Pulse Rate: 91 Resp: 19 BP: 141/80 mmHg Patient Position, if appropriate: Lying Oxygen Therapy SpO2: 91 % O2 Device: None (Room air) Pain: Pain reported as 2/10 in bil knees  See FIM for current functional status  Therapy/Group: Individual Therapy  Daneil Dan 02/08/2013, 7:46 AM

## 2013-02-08 NOTE — Progress Notes (Signed)
Social Work Patient ID: [name] [name], male   DOB: 09-May-1951, 62 y.o.   MRN: [id number] Met with pt to discuss if he will need a walker for home.  He will find out form his wife when she is here later tonight.  Will check back in the early am So cn order and get before he is discharged at 12;00.  [facility] to follow up at home.  Pt is pleased with progress and ready for discharge.

## 2013-02-08 NOTE — Progress Notes (Signed)
Occupational Therapy Discharge Summary  Patient Details  Name: [name] [name] MRN: [id number] Date of Birth: [birth year]  Today's Date: 02/08/2013 Time: 1330-1400 Time calculation (min): 30 min   Patient has met 7 of 7 long term goals due to improved activity tolerance, improved balance, postural control, ability to compensate for deficits and improved coordination.  Patient to discharge at overall Modified Independent level.  Patient's care partner not necessary to provide the necessary physical and cognitive assistance at discharge as patient is at Modified Independent level.    Reasons goals not met: N/A. All LTGs met.   Recommendation:  Patient will benefit from ongoing skilled OT services in not necessary to continue to advance functional skills in the area of BADL.  Equipment: TTB  Reasons for discharge: treatment goals met  Patient/family agrees with progress made and goals achieved: Yes  Skilled Therapeutic Intervention: Therapy session focused on dynamic standing balance, activity tolerance, functional transfers, and functional ambulation. Pt received sitting eob and ambulated to ADL apartment to practice furniture transfer from low surface. Pt reported not having couch as low as this one however aware that he may be in a situation where he needs to sit in a lower chair. Completed at Mod I. Engaged in marching in parallel bars to increase knee flexion during functional ambulation. Completed sit<>stand x10 with frequent rest breaks d/t fatigue. Pt then returned to room at Mod I level. Pt reported no question or concerns about discharge at this time.   OT Discharge Precautions/Restrictions  Precautions Precautions: Fall;Knee Restrictions Weight Bearing Restrictions: No Other Position/Activity Restrictions: WBAT General Amount of Missed OT Time (min): 15 Minutes Vital Signs Therapy Vitals Temp: 97.2 F (36.2 C) Temp src: Oral Pulse Rate: 91 Resp: 19 BP: 141/80  mmHg Patient Position, if appropriate: Lying Oxygen Therapy SpO2: 91 % O2 Device: None (Room air) Pain   ADL   Vision/Perception  Vision - History Baseline Vision: Wears glasses only for reading Visual History: Cataracts Patient Visual Report: No change from baseline Vision - Assessment Eye Alignment: Within Functional Limits  Cognition Overall Cognitive Status: Within Functional Limits for tasks assessed Orientation Level: Oriented X4 Attention: Alternating Alternating Attention: Appears intact Memory: Appears intact Awareness: Appears intact Problem Solving: Appears intact Safety/Judgment: Appears intact Sensation Sensation Light Touch: Appears Intact Hot/Cold: Appears Intact Proprioception: Appears Intact Coordination Gross Motor Movements are Fluid and Coordinated: Yes Fine Motor Movements are Fluid and Coordinated: Yes Motor    Mobility     Trunk/Postural Assessment     Balance   Extremity/Trunk Assessment RUE Assessment RUE Assessment: Within Functional Limits LUE Assessment LUE Assessment: Within Functional Limits  See FIM for current functional status  Ted Leonhart N 02/08/2013, 7:51 AM

## 2013-02-08 NOTE — Progress Notes (Signed)
Physical Therapy Session Note  Patient Details  Name: [name] [name] MRN: [id number] Date of Birth: Aug 18, 1950  Today's Date: 02/08/2013 Time: 4098-1191 Time Calculation (min): 44 min  Short Term Goals: Week 1:  PT Short Term Goal 1 (Week 1): STGs=LTGs due to short ELOS  Skilled Therapeutic Interventions/Progress Updates:   Pt received sitting at EOB and agreeable to therapy this morning.  States he needs to use restroom and was able to ambulate in/out of restroom in room with RW at Mod I level.  Ambulated to/from gym at Mod I level with RW.  Note that he has less knee flexion during swing phase on RLE vs LLE.  Performed seated Nustep using BUE/LEs at level 4 resistance.  Performed 4 mins at more comfortable distance from pedals, then lowered pt down once notch to increased B knee ROM during activity.  Performed this another 8 mins with no increased in pain, only with c/o tightness.  Performed controlled descent using R LE then LLE on 4" step for improved quad strength and also for increased ROM.  Pt requires use of handrail with BUEs, however was able to perform at supervision level with cues for upright posture.  Performed seated hip flexor stretch on mat with small wedge under back for improved comfort.  Feet unsupported during activity and pt able to actively (mild AAROM) flex knees to at least 90 deg on mat table.  Pt ambulated back to room and took seat at EOB.  All needs in place, however pt is Mod I in room with RW.   Therapy Documentation Precautions:  Precautions Precautions: Knee Precaution Comments: SLR without 10 degree lag on 02/06/13 bilaterally Required Braces or Orthoses: Knee Immobilizer - Right Knee Immobilizer - Right: Discontinue once straight leg raise with < 10 degree lag Knee Immobilizer - Left: Discontinue once straight leg raise with < 10 degree lag Restrictions Weight Bearing Restrictions: No Other Position/Activity Restrictions: WBAT   Pain: pt states 3/10 pain  during activity  See FIM for current functional status  Therapy/Group: Individual Therapy  Vista Deck 02/08/2013, 12:11 PM

## 2013-02-08 NOTE — Progress Notes (Signed)
Physical Therapy Discharge Summary  Patient Details  Name: [name] [name] MRN: [id number] Date of Birth: May 12, 1951  Today's Date: 02/08/2013 Time: 4098-1191 Time Calculation (min): 56 min  Patient has met 9 of 9 long term goals due to improved activity tolerance, improved balance, increased strength, increased range of motion, decreased pain and ability to compensate for deficits.  Patient to discharge at an ambulatory level Modified Independent.   Patient's care partner not necessary secondary to patient discharging at mod I level to provide the necessary assistance at discharge.  Reasons goals not met: N/A, all LTGs met.  Recommendation:  Patient will benefit from ongoing skilled PT services in home health setting to continue to advance safe functional mobility, address ongoing impairments in gait, B knee ROM, B LE strength, and minimize fall risk.  Equipment: No equipment provided  Reasons for discharge: treatment goals met and discharge from hospital  Patient/family agrees with progress made and goals achieved: Yes  PT Discharge Precautions/Restrictions Precautions Precautions: Knee Precaution Comments: SLR without 10 degree lag on 02/06/13 bilaterally Restrictions Weight Bearing Restrictions: No Other Position/Activity Restrictions: WBAT Pain Pain Assessment Pain Assessment: No/denies pain Pain Score: 0-No pain Vision/Perception  Vision - History Baseline Vision: Wears glasses only for reading Visual History: Cataracts Patient Visual Report: No change from baseline  Cognition Overall Cognitive Status: Within Functional Limits for tasks assessed Orientation Level: Oriented X4 Attention: Alternating;Divided Alternating Attention: Appears intact Divided Attention: Appears intact Memory: Appears intact Awareness: Appears intact Problem Solving: Appears intact Safety/Judgment: Appears intact Sensation Sensation Light Touch: Appears Intact Proprioception: Appears  Intact Additional Comments: Proprioception intact at B ankles Coordination Gross Motor Movements are Fluid and Coordinated: Yes Fine Motor Movements are Fluid and Coordinated: Yes Motor  Motor Motor: Within Functional Limits  Mobility Bed Mobility Bed Mobility: Supine to Sit;Sit to Supine Supine to Sit: 6: Modified independent (Device/Increase time);HOB flat Sit to Supine: 6: Modified independent (Device/Increase time);HOB flat Transfers Sit to Stand: With upper extremity assist;From chair/3-in-1;From bed;6: Modified independent (Device/Increase time) Stand to Sit: To chair/3-in-1;To bed;With upper extremity assist;6: Modified independent (Device/Increase time) Stand Pivot Transfers: 6: Modified independent (Device/Increase time) Locomotion  Ambulation Ambulation: Yes Ambulation/Gait Assistance: 6: Modified independent (Device/Increase time) Ambulation Distance (Feet): 300 Feet Assistive device: Rolling walker Ambulation/Gait Assistance Details: Patient performed gait training >300' x1, 150' x1 with RW and mod I. Patient required to perform gait trianing on carpet and tile surfaces and able to negotiate busy hospital hallways as well as confined spaces in his room.  Gait Gait: Yes Gait Pattern: Impaired Gait Pattern: Step-through pattern;Decreased hip/knee flexion - left;Decreased hip/knee flexion - right;Wide base of support Stairs / Additional Locomotion Stairs: Yes Stairs Assistance: 6: Modified independent (Device/Increase time) Stairs Assistance Details (indicate cue type and reason): Patient negotiated 7 stairs without rails with RW (curb-like) to simulate STE home. Patient's steps are deep enough to fit RW. Stair Management Technique: No rails;Step to pattern;Forwards;With walker Number of Stairs: 7 Height of Stairs: 6 Curb: 6: Modified independent (Device/increase time) (with RW, x7 trials) Wheelchair Mobility Wheelchair Mobility: No  Trunk/Postural Assessment  Cervical  Assessment Cervical Assessment: Within Functional Limits Thoracic Assessment Thoracic Assessment: Within Functional Limits Lumbar Assessment Lumbar Assessment: Within Functional Limits Postural Control Postural Control: Within Functional Limits  Balance Balance Balance Assessed: Yes Static Sitting Balance Static Sitting - Balance Support: No upper extremity supported;Feet supported Static Sitting - Level of Assistance: 6: Modified independent (Device/Increase time) Static Sitting - Comment/# of Minutes: 3 Static Standing Balance Static Standing -  Balance Support: Bilateral upper extremity supported Static Standing - Level of Assistance: 6: Modified independent (Device/Increase time) Static Standing - Comment/# of Minutes: 1 min Extremity Assessment  RLE Assessment RLE Assessment: Exceptions to [facility] RLE AROM (degrees) Right Knee Extension: 6 Right Knee Flexion: 93 RLE Strength RLE Overall Strength: Deficits RLE Overall Strength Comments: Grossly 4/5, R knee strength limited by ROM deficits and therefore 2+ to 3/5 LLE Assessment LLE Assessment: Exceptions to WFL LLE AROM (degrees) Left Knee Extension: 10 Left Knee Flexion: 92 LLE Strength LLE Overall Strength: Deficits LLE Overall Strength Comments: Grossly 4/5, R knee strength limited by ROM deficits and therefore 2+ to 3/5  See FIM for current functional status  Keimya Briddell S Rheya Minogue S. Katieann Hungate, PT, DPT 02/08/2013, 12:13 PM

## 2013-02-08 NOTE — IPOC Note (Signed)
Overall Plan of Care [facility]) Patient Details Name: CHANNON AMBROSINI MRN: [id number] DOB: [birth year]  Admitting Diagnosis: B TKR  Hospital Problems: Principal Problem:   OA (osteoarthritis) of bilateral knees Active Problems:   Postoperative anemia due to acute blood loss   Leucocytosis     Functional Problem List: Nursing    PT Balance;Endurance;Motor;Pain;Safety  OT Motor;Balance;Pain;Safety  SLP    TR         Basic ADL's: OT Grooming;Bathing;Dressing;Toileting     Advanced  ADL's: OT       Transfers: PT Bed Mobility;Bed to Chair;Car;Furniture  OT Toilet;Tub/Shower     Locomotion: PT Ambulation;Wheelchair Mobility;Stairs     Additional Impairments: OT None  SLP        TR      Anticipated Outcomes Item Anticipated Outcome  Self Feeding    Swallowing      Basic self-care  Mod I  Toileting  Mod I    Bathroom Transfers Mod I   Bowel/Bladder     Transfers  Mod I with LRAD  Locomotion  No wheelchair goals set; Mod I ambulation and stairs  Communication     Cognition     Pain     Safety/Judgment      Therapy Plan: PT Intensity: Minimum of 1-2 x/day ,45 to 90 minutes PT Frequency: 5 out of 7 days PT Duration Estimated Length of Stay: 5-7 days OT Intensity: Minimum of 1-2 x/day, 45 to 90 minutes OT Frequency: 5 out of 7 days OT Duration/Estimated Length of Stay: 5-7 days         Team Interventions: Nursing Interventions    PT interventions Ambulation/gait training;Balance/vestibular training;Community reintegration;Discharge planning;Neuromuscular re-education;Functional mobility training;DME/adaptive equipment instruction;Disease management/prevention;Pain management;Patient/family education;Psychosocial support;UE/LE Coordination activities;UE/LE Strength taining/ROM;Therapeutic Exercise;Therapeutic Activities;Wheelchair propulsion/positioning;Stair training  OT Interventions Balance/vestibular training;Community reintegration;Discharge  planning;DME/adaptive equipment instruction;Functional mobility training;Pain management;Psychosocial support;Patient/family education;Self Care/advanced ADL retraining;Therapeutic Activities;Therapeutic Exercise;UE/LE Strength taining/ROM;UE/LE Coordination activities  SLP Interventions    TR Interventions    SW/CM Interventions Discharge Planning;Psychosocial Support;Patient/Family Education    Team Discharge Planning: Destination: PT-Home ,OT- Home , SLP-  Projected Follow-up: PT-Outpatient PT, OT-  None, SLP-  Projected Equipment Needs: PT-None recommended by PT, OT- Tub/shower bench, SLP-  Patient/family involved in discharge planning: PT- Patient,  OT-Patient, SLP-   MD ELOS: 5 days Medical Rehab Prognosis:  Good Assessment: 62 y.o. male with history of RLS, endstage OA bilateral knees who elected to undergo B-TKR on 01/31/13 by Dr. [name]. Post op WBAT and on coumadin/lovenox bridge for DVT prophylaxis. He did have hypotension after epidural bolus that responded well to fluid resuscitation. ABLA noted  Now requiring 24/7 Rehab RN,MD, as well as CIR level PT, OT .  Treatment team will focus on ADLs and mobility with goals set at Mod I     See Team Conference Notes for weekly updates to the plan of care

## 2013-02-08 NOTE — Progress Notes (Signed)
ANTICOAGULATION CONSULT NOTE - Follow Up Consult  Pharmacy Consult for coumadin Indication: VTE prophylaxis  Allergies  Allergen Reactions  . Penicillins     REACTION: swelling    Patient Measurements:   Heparin Dosing Weight:   Vital Signs: Temp: 97.2 F (36.2 C) (09/18 0547) Temp src: Oral (09/18 0547) BP: 141/80 mmHg (09/18 0547) Pulse Rate: 91 (09/18 0547)  Labs:  Recent Labs  02/06/13 0600 02/06/13 1425 02/07/13 0915 02/08/13 0530  HGB 9.7*  --   --  9.8*  HCT 28.0*  --   --  28.8*  PLT 209  --   --  274  LABPROT  --  17.0* 17.0* 17.6*  INR  --  1.42 1.42 1.49  CREATININE 0.91  --   --   --     The CrCl is unknown because both a height and weight (above a minimum accepted value) are required for this calculation.   Medications:  Scheduled:  . enoxaparin (LOVENOX) injection  30 mg Subcutaneous Q12H  . iron polysaccharides  150 mg Oral BID AC  . methocarbamol  500 mg Oral QID  . polyethylene glycol  17 g Oral BID  . senna-docusate  2 tablet Oral BID  . Warfarin - Pharmacist Dosing Inpatient   Does not apply q1800   Infusions:    Assessment: 62 yo male s/p TKA is currently on subtherapeutic coumadin for VTE px.  INR today is 1.49.   Goal of Therapy:  INR 2-3 Monitor platelets by anticoagulation protocol: Yes   Plan:  1) Coumadin 10mg  again today 2) INR in am and continue lovenox 30mg  sq q12h until INR is >2  Katharin Schneider, Tsz-Yin 02/08/2013,8:09 AM

## 2013-02-08 NOTE — Progress Notes (Signed)
Patient ID: [name] [name], male   DOB: May 20, 1951, 62 y.o.   MRN: [id number] Subjective/Complaints: [name] [name] is a 62 y.o. male with history of RLS, endstage OA bilateral knees who elected to undergo B-TKR on 01/31/13 by Dr. [name]. Post op WBAT and on coumadin/lovenox bridge for DVT prophylaxis. He did have hypotension after epidural bolus that responded well to fluid resuscitation. Still constipated  Pain ok Pt concerned about wound  Review of Systems  Unable to perform ROS Musculoskeletal: Positive for joint pain.  All other systems reviewed and are negative.    Objective: Vital Signs: Blood pressure 141/80, pulse 91, temperature 97.2 F (36.2 C), temperature source Oral, resp. rate 19, SpO2 91.00%. No results found. Results for orders placed during the hospital encounter of 02/05/13 (from the past 72 hour(s))  URINALYSIS, ROUTINE W REFLEX MICROSCOPIC     Status: None   Collection Time    02/05/13  4:27 PM      Result Value Range   Color, Urine YELLOW  YELLOW   APPearance CLEAR  CLEAR   Specific Gravity, Urine 1.012  1.005 - 1.030   pH 6.5  5.0 - 8.0   Glucose, UA NEGATIVE  NEGATIVE mg/dL   Hgb urine dipstick NEGATIVE  NEGATIVE   Bilirubin Urine NEGATIVE  NEGATIVE   Ketones, ur NEGATIVE  NEGATIVE mg/dL   Protein, ur NEGATIVE  NEGATIVE mg/dL   Urobilinogen, UA 1.0  0.0 - 1.0 mg/dL   Nitrite NEGATIVE  NEGATIVE   Leukocytes, UA NEGATIVE  NEGATIVE   Comment: MICROSCOPIC NOT DONE ON URINES WITH NEGATIVE PROTEIN, BLOOD, LEUKOCYTES, NITRITE, OR GLUCOSE <1000 mg/dL.  URINE CULTURE     Status: None   Collection Time    02/05/13  4:27 PM      Result Value Range   Specimen Description URINE, CLEAN CATCH     Special Requests NONE     Culture  Setup Time       Value: 02/05/2013 18:34     Performed at [facility] Count       Value: NO GROWTH     Performed at [facility]   Culture       Value: NO GROWTH     Performed at [facility]    Report Status 02/06/2013 FINAL    COMPREHENSIVE METABOLIC PANEL     Status: Abnormal   Collection Time    02/06/13  6:00 AM      Result Value Range   Sodium 136  135 - 145 mEq/L   Potassium 3.5  3.5 - 5.1 mEq/L   Chloride 99  96 - 112 mEq/L   CO2 27  19 - 32 mEq/L   Glucose, Bld 108 (*) 70 - 99 mg/dL   BUN 15  6 - 23 mg/dL   Creatinine, Ser 7.82  0.50 - 1.35 mg/dL   Calcium 8.6  8.4 - 95.6 mg/dL   Total Protein 5.7 (*) 6.0 - 8.3 g/dL   Albumin 2.4 (*) 3.5 - 5.2 g/dL   AST 21  0 - 37 U/L   ALT 23  0 - 53 U/L   Alkaline Phosphatase 56  39 - 117 U/L   Total Bilirubin 0.8  0.3 - 1.2 mg/dL   GFR calc non Af Amer 88 (*) >90 mL/min   GFR calc Af Amer >90  >90 mL/min   Comment: (NOTE)     The eGFR has been calculated using the CKD EPI equation.  This calculation has not been validated in all clinical situations.     eGFR's persistently <90 mL/min signify possible Chronic Kidney     Disease.  CBC WITH DIFFERENTIAL     Status: Abnormal   Collection Time    02/06/13  6:00 AM      Result Value Range   WBC 6.9  4.0 - 10.5 K/uL   RBC 3.28 (*) 4.22 - 5.81 MIL/uL   Hemoglobin 9.7 (*) 13.0 - 17.0 g/dL   HCT 16.1 (*) 09.6 - 04.5 %   MCV 85.4  78.0 - 100.0 fL   MCH 29.6  26.0 - 34.0 pg   MCHC 34.6  30.0 - 36.0 g/dL   RDW 40.9  81.1 - 91.4 %   Platelets 209  150 - 400 K/uL   Neutrophils Relative % 56  43 - 77 %   Neutro Abs 3.9  1.7 - 7.7 K/uL   Lymphocytes Relative 23  12 - 46 %   Lymphs Abs 1.6  0.7 - 4.0 K/uL   Monocytes Relative 15 (*) 3 - 12 %   Monocytes Absolute 1.0  0.1 - 1.0 K/uL   Eosinophils Relative 6 (*) 0 - 5 %   Eosinophils Absolute 0.4  0.0 - 0.7 K/uL   Basophils Relative 0  0 - 1 %   Basophils Absolute 0.0  0.0 - 0.1 K/uL  PROTIME-INR     Status: Abnormal   Collection Time    02/06/13  2:25 PM      Result Value Range   Prothrombin Time 17.0 (*) 11.6 - 15.2 seconds   INR 1.42  0.00 - 1.49  PROTIME-INR     Status: Abnormal   Collection Time    02/07/13  9:15 AM       Result Value Range   Prothrombin Time 17.0 (*) 11.6 - 15.2 seconds   INR 1.42  0.00 - 1.49  PROTIME-INR     Status: Abnormal   Collection Time    02/08/13  5:30 AM      Result Value Range   Prothrombin Time 17.6 (*) 11.6 - 15.2 seconds   INR 1.49  0.00 - 1.49  CBC     Status: Abnormal   Collection Time    02/08/13  5:30 AM      Result Value Range   WBC 8.5  4.0 - 10.5 K/uL   RBC 3.35 (*) 4.22 - 5.81 MIL/uL   Hemoglobin 9.8 (*) 13.0 - 17.0 g/dL   HCT 78.2 (*) 95.6 - 21.3 %   MCV 86.0  78.0 - 100.0 fL   MCH 29.3  26.0 - 34.0 pg   MCHC 34.0  30.0 - 36.0 g/dL   RDW 08.6  57.8 - 46.9 %   Platelets 274  150 - 400 K/uL   Comment: REPEATED TO VERIFY     HEENT: normal and AT/[place] Cardio: RRR and no murmurs Resp: CTA B/L and no respiratory distress GI: BS positive and non distended Extremity:  Pulses positive and No Edema Skin:   Wound C/D/I and bilateral knees, steristrips falling off no dehiscence Neuro: Alert/Oriented, Anxious, Normal Sensory, Abnormal Motor 5/5 in BUE, 3-/5 HF, KE 3-/5, 5/5 in Bilateral ankles and Normal FMC Musc/Skel:  Other decreased Knee flex and ext bilateral Gen NAD   Assessment/Plan: 1. Functional deficits secondary to endstage OA Bilateral knee s/p TKR which require 3+ hours per day of interdisciplinary therapy in a comprehensive inpatient rehab setting. Physiatrist is providing close team  supervision and 24 hour management of active medical problems listed below. Physiatrist and rehab team continue to assess barriers to discharge/monitor patient progress toward functional and medical goals. FIM: FIM - Bathing Bathing Steps Patient Completed: Front perineal area;Chest;Right lower leg (including foot);Left lower leg (including foot);Buttocks;Right Arm;Left Arm;Right upper leg;Left upper leg;Abdomen Bathing: 6: Assistive device (Comment) (TTB)  FIM - Upper Body Dressing/Undressing Upper body dressing/undressing steps patient completed: Thread/unthread  right sleeve of pullover shirt/dresss;Thread/unthread left sleeve of pullover shirt/dress;Put head through opening of pull over shirt/dress;Pull shirt over trunk Upper body dressing/undressing: 6: More than reasonable amount of time FIM - Lower Body Dressing/Undressing Lower body dressing/undressing steps patient completed: Pull underwear up/down;Thread/unthread right underwear leg;Thread/unthread left underwear leg;Thread/unthread right pants leg;Pull pants up/down;Thread/unthread left pants leg;Don/Doff right sock;Don/Doff left sock;Don/Doff right shoe;Don/Doff left shoe;Fasten/unfasten right shoe;Fasten/unfasten left shoe Lower body dressing/undressing: 6: More than reasonable amount of time  FIM - Toileting Toileting steps completed by patient: Adjust clothing prior to toileting;Performs perineal hygiene;Adjust clothing after toileting Toileting Assistive Devices: Grab bar or rail for support Toileting: 6: More than reasonable amount of time  FIM - Diplomatic Services operational officer Devices: Art gallery manager Transfers: 6-Assistive device: No helper  FIM - Banker Devices: Environmental consultant;Arm rests Bed/Chair Transfer: 6: Assistive device: no helper;6: Supine > Sit: No assist;6: Sit > Supine: No assist;6: Bed > Chair or W/C: No assist;6: Chair or W/C > Bed: No assist  FIM - Locomotion: Wheelchair Locomotion: Wheelchair: 0: Activity did not occur FIM - Locomotion: Ambulation Locomotion: Ambulation Assistive Devices: Designer, industrial/product Ambulation/Gait Assistance: 6: Modified independent (Device/Increase time) Locomotion: Ambulation: 6: Travels 150 ft or more with assistive device/no helper  Comprehension Comprehension Mode: Auditory Comprehension: 7-Follows complex conversation/direction: With no assist  Expression Expression Mode: Verbal Expression: 7-Expresses complex ideas: With no assist  Social Interaction Social Interaction: 7-Interacts  appropriately with others - No medications needed.  Problem Solving Problem Solving: 7-Solves complex problems: Recognizes & self-corrects  Memory Memory: 7-Complete Independence: No helper Medical Problem List and Plan:  1. DVT Prophylaxis/Anticoagulation: Pharmaceutical: Coumadin and Lovenox  2. Pain Management: Will add oxycontin for more consistent pain relief. Schedule robaxin qid. Stop oxycontin 3. Mood: Has a good outlook. Will have LCSW follow for evaluation.  4. Neuropsych: This patient is capable of making decisions on his own behalf.  5. ABLA: Recheck lab in am. May need to add iron supplement  6. Reactive leucocytosis: Monitor wound and for fevers. Recheck CBC for follow up in am. Check ucs  7. Constipation: Will increase senna-- enema today with good results 8. Hesitancy: will check PVRs. Negative UA/UCS. LOS (Days) 3 A FACE TO FACE EVALUATION WAS PERFORMED  KIRSTEINS,ANDREW E 02/08/2013, 7:50 AM

## 2013-02-09 LAB — PROTIME-INR
INR: 1.92 — ABNORMAL HIGH (ref 0.00–1.49)
Prothrombin Time: 21.4 seconds — ABNORMAL HIGH (ref 11.6–15.2)

## 2013-02-09 MED ORDER — ENOXAPARIN (LOVENOX) PATIENT EDUCATION KIT
PACK | Freq: Once | Status: DC
  Filled 2013-02-09: qty 1

## 2013-02-09 MED ORDER — TRAMADOL HCL 50 MG PO TABS: 50.0000 mg | ORAL_TABLET | Freq: Four times a day (QID) | ORAL | Status: AC | PRN

## 2013-02-09 MED ORDER — SENNOSIDES-DOCUSATE SODIUM 8.6-50 MG PO TABS: 2.0000 | ORAL_TABLET | Freq: Two times a day (BID) | ORAL | Status: AC

## 2013-02-09 MED ORDER — POLYSACCHARIDE IRON COMPLEX 150 MG PO CAPS: 150.0000 mg | ORAL_CAPSULE | Freq: Two times a day (BID) | ORAL | Status: AC

## 2013-02-09 MED ORDER — METHOCARBAMOL 500 MG PO TABS: 500.0000 mg | ORAL_TABLET | Freq: Four times a day (QID) | ORAL | Status: AC | PRN

## 2013-02-09 MED ORDER — ENOXAPARIN SODIUM 30 MG/0.3ML ~~LOC~~ SOLN: 30.0000 mg | Freq: Two times a day (BID) | SUBCUTANEOUS | Status: AC

## 2013-02-09 MED ORDER — WARFARIN SODIUM 7.5 MG PO TABS
7.5000 mg | ORAL_TABLET | Freq: Once | ORAL | Status: DC
  Filled 2013-02-09: qty 1

## 2013-02-09 MED ORDER — POLYETHYLENE GLYCOL 3350 17 G PO PACK: 17.0000 g | PACK | Freq: Two times a day (BID) | ORAL | Status: AC

## 2013-02-09 MED ORDER — WARFARIN SODIUM 5 MG PO TABS: ORAL_TABLET | ORAL | Status: AC

## 2013-02-09 NOTE — Progress Notes (Signed)
Patient ID: [name] [name], male   DOB: 10-13-1950, 62 y.o.   MRN: [id number] Subjective/Complaints: [name] [name] is a 62 y.o. male with history of RLS, endstage OA bilateral knees who elected to undergo B-TKR on 01/31/13 by Dr. [name]. Post op WBAT and on coumadin/lovenox bridge for DVT prophylaxis. He did have hypotension after epidural bolus that responded well to fluid resuscitation. Still constipated  Frequent stools after laxatives otherwise excited about D/C  Review of Systems  Unable to perform ROS Musculoskeletal: Positive for joint pain.  All other systems reviewed and are negative.    Objective: Vital Signs: Blood pressure 133/68, pulse 80, temperature 99 F (37.2 C), temperature source Oral, resp. rate 19, SpO2 99.00%. No results found. Results for orders placed during the hospital encounter of 02/05/13 (from the past 72 hour(s))  PROTIME-INR     Status: Abnormal   Collection Time    02/06/13  2:25 PM      Result Value Range   Prothrombin Time 17.0 (*) 11.6 - 15.2 seconds   INR 1.42  0.00 - 1.49  PROTIME-INR     Status: Abnormal   Collection Time    02/07/13  9:15 AM      Result Value Range   Prothrombin Time 17.0 (*) 11.6 - 15.2 seconds   INR 1.42  0.00 - 1.49  PROTIME-INR     Status: Abnormal   Collection Time    02/08/13  5:30 AM      Result Value Range   Prothrombin Time 17.6 (*) 11.6 - 15.2 seconds   INR 1.49  0.00 - 1.49  CBC     Status: Abnormal   Collection Time    02/08/13  5:30 AM      Result Value Range   WBC 8.5  4.0 - 10.5 K/uL   RBC 3.35 (*) 4.22 - 5.81 MIL/uL   Hemoglobin 9.8 (*) 13.0 - 17.0 g/dL   HCT 11.9 (*) 14.7 - 82.9 %   MCV 86.0  78.0 - 100.0 fL   MCH 29.3  26.0 - 34.0 pg   MCHC 34.0  30.0 - 36.0 g/dL   RDW 56.2  13.0 - 86.5 %   Platelets 274  150 - 400 K/uL   Comment: REPEATED TO VERIFY  PROTIME-INR     Status: Abnormal   Collection Time    02/09/13  5:45 AM      Result Value Range   Prothrombin Time 21.4 (*) 11.6 - 15.2 seconds    INR 1.92 (*) 0.00 - 1.49     HEENT: normal and AT/[place] Cardio: RRR and no murmurs Resp: CTA B/L and no respiratory distress GI: BS positive and non distended Extremity:  Pulses positive and No Edema Skin:   Wound C/D/I and bilateral knees, steristrips falling off no dehiscence Neuro: Alert/Oriented, Anxious, Normal Sensory, Abnormal Motor 5/5 in BUE, 3-/5 HF, KE 3-/5, 5/5 in Bilateral ankles and Normal FMC Musc/Skel:  Other decreased Knee flex and ext bilateral Gen NAD   Assessment/Plan: 1. Functional deficits secondary to endstage OA Bilateral knee s/p TKR which require 3+ hours per day of interdisciplinary therapy in a comprehensive inpatient rehab setting. Physiatrist is providing close team supervision and 24 hour management of active medical problems listed below. Physiatrist and rehab team continue to assess barriers to discharge/monitor patient progress toward functional and medical goals. Stable for D/C today F/u PCP in 1-2 weeks F/u ortho 1-2 weeks See D/C summary See D/C instructions FIM: FIM - Bathing Bathing  Steps Patient Completed: Front perineal area;Chest;Right lower leg (including foot);Left lower leg (including foot);Buttocks;Right Arm;Left Arm;Right upper leg;Left upper leg;Abdomen Bathing: 6: Assistive device (Comment) (TTB)  FIM - Upper Body Dressing/Undressing Upper body dressing/undressing steps patient completed: Thread/unthread right sleeve of pullover shirt/dresss;Thread/unthread left sleeve of pullover shirt/dress;Put head through opening of pull over shirt/dress;Pull shirt over trunk Upper body dressing/undressing: 6: More than reasonable amount of time FIM - Lower Body Dressing/Undressing Lower body dressing/undressing steps patient completed: Pull underwear up/down;Thread/unthread right underwear leg;Thread/unthread left underwear leg;Thread/unthread right pants leg;Pull pants up/down;Thread/unthread left pants leg;Don/Doff right sock;Don/Doff left  sock;Don/Doff right shoe;Don/Doff left shoe;Fasten/unfasten right shoe;Fasten/unfasten left shoe Lower body dressing/undressing: 6: More than reasonable amount of time  FIM - Toileting Toileting steps completed by patient: Adjust clothing prior to toileting;Performs perineal hygiene;Adjust clothing after toileting Toileting Assistive Devices: Grab bar or rail for support Toileting: 6: More than reasonable amount of time  FIM - Diplomatic Services operational officer Devices: Art gallery manager Transfers: 6-Assistive device: No helper  FIM - Banker Devices: Environmental consultant;Arm rests Bed/Chair Transfer: 6: Supine > Sit: No assist;6: Sit > Supine: No assist;6: Bed > Chair or W/C: No assist;6: Chair or W/C > Bed: No assist;6: Assistive device: no helper;6: More than reasonable amt of time  FIM - Locomotion: Wheelchair Locomotion: Wheelchair: 0: Activity did not occur FIM - Locomotion: Ambulation Locomotion: Ambulation Assistive Devices: Designer, industrial/product Ambulation/Gait Assistance: 6: Modified independent (Device/Increase time) Locomotion: Ambulation: 6: Travels 150 ft or more with assistive device/no helper  Comprehension Comprehension Mode: Auditory Comprehension: 7-Follows complex conversation/direction: With no assist  Expression Expression Mode: Verbal Expression: 7-Expresses complex ideas: With no assist  Social Interaction Social Interaction: 7-Interacts appropriately with others - No medications needed.  Problem Solving Problem Solving: 7-Solves complex problems: Recognizes & self-corrects  Memory Memory: 7-Complete Independence: No helper Medical Problem List and Plan:  1. DVT Prophylaxis/Anticoagulation: Pharmaceutical: Coumadin and Lovenox  2. Pain Management: Will add oxycontin for more consistent pain relief. Schedule robaxin qid. Stop oxycontin 3. Mood: Has a good outlook. Will have LCSW follow for evaluation.  4. Neuropsych: This  patient is capable of making decisions on his own behalf.  5. ABLA: Recheck lab in am. May need to add iron supplement  6. Reactive leucocytosis: Monitor wound and for fevers. Recheck CBC for follow up in am. Check ucs  7. Constipation: Will increase senna-- enema today with good results  LOS (Days) 4 A FACE TO FACE EVALUATION WAS PERFORMED  KIRSTEINS,ANDREW E 02/09/2013, 8:11 AM

## 2013-02-09 NOTE — Discharge Summary (Signed)
Physician Discharge Summary  Patient ID: SAL SPRATLEY MRN: [id number] DOB/AGE: [birth year] 62 y.o.  Admit date: 02/05/2013 Discharge date: 02/09/2013  Discharge Diagnoses:  Principal Problem:   OA (osteoarthritis) of bilateral knees Active Problems:   Postoperative anemia due to acute blood loss   Leucocytosis   Discharged Condition:  Stable.   Significant Diagnostic Studies: No results found.  Labs:  Basic Metabolic Panel:  Recent Labs Lab 02/06/13 0600  NA 136  K 3.5  CL 99  CO2 27  GLUCOSE 108*  BUN 15  CREATININE 0.91  CALCIUM 8.6    CBC:  Recent Labs Lab 02/03/13 0434 02/06/13 0600 02/08/13 0530  WBC 11.7* 6.9 8.5  NEUTROABS  --  3.9  --   HGB 10.1* 9.7* 9.8*  HCT 29.5* 28.0* 28.8*  MCV 86.8 85.4 86.0  PLT 171 209 274    CBG: No results found for this basename: GLUCAP,  in the last 168 hours  Brief HPI:   [name] [name] is a 62 y.o. male with history of RLS, endstage OA bilateral knees who elected to undergo B-TKR on 01/31/13 by Dr. [name]. Post op WBAT and on coumadin/lovenox bridge for DVT prophylaxis. He did have hypotension after epidural bolus that responded well to fluid resuscitation. ABLA noted with hgb at 10.1. On epidural for pain management. PT/OT initiated and CIR recommended by therapy team   Hospital Course: STOY FENN was admitted to rehab 02/05/2013 for inpatient therapies to consist of PT, ST and OT at least three hours five days a week. Past admission physiatrist, therapy team and rehab RN have worked together to provide customized collaborative inpatient rehab. He was cross covered with lovenox and is to continue on this past discharge. Bilateral knee incisions have been healing well. Incisions are clean and dry without s/s of infection. He continues to have moderate edema from bilateral knees down with layering ecchymosis bilateral thighs. H/H has been monitored and is stable. Blood pressures have been controlled and he has been  afebrile. He has had narcotic induced constipation and aggressive bowel program was initiated.   He has made good progress during his rehab stay and will continue to receive HHPT by [facility] services past discharge.   Rehab course: During patient's stay in rehab team conference was held to monitor patient's progress, set goals and discuss barriers to discharge. Patient has had improvement in activity tolerance, balance, postural control, as well as ability to compensate for deficits.  He is modified independent for bathing, dressing and simple home management tasks. He is modified independent for ambulating house hold distances as well as navigating 8 stairs with use of RW.  He is able to flex bilateral knees to 90 degrees.   Disposition: 62-Rehab Facility  Diet: Regular.   Special Instructions: 1. Need to be on lovenox twice a day till comadin levels therapeutic.  Home health will manage and dose coumadin.  2. Wear support stocking to help with swelling. Keep legs elevated when seated.  3. Contact surgeon for any signs of infection--redness, drainage, fevers, or  chills     Medication List    STOP taking these medications       DSS 100 MG Caps     metoCLOPramide 5 MG tablet  Commonly known as:  REGLAN     ondansetron 4 MG tablet  Commonly known as:  ZOFRAN     oxyCODONE 5 MG immediate release tablet  Commonly known as:  Oxy IR/ROXICODONE  TAKE these medications       bisacodyl 10 MG suppository  Commonly known as:  DULCOLAX  Place 1 suppository (10 mg total) rectally daily as needed.     enoxaparin 30 MG/0.3ML injection  Commonly known as:  LOVENOX  Inject 0.3 mLs (30 mg total) into the skin every 12 (twelve) hours. Continue Lovenox injections until the INR is therapeutic at or greater than 2.0.  When INR reaches the therapeutic level of equal to or greater than 2.0, the patient may discontinue the Lovenox injections.     iron polysaccharides 150 MG capsule  Commonly  known as:  NIFEREX  Take 1 capsule (150 mg total) by mouth 2 (two) times daily before lunch and supper. For anemia     methocarbamol 500 MG tablet  Commonly known as:  ROBAXIN  Take 1 tablet (500 mg total) by mouth every 6 (six) hours as needed.     polyethylene glycol packet  Commonly known as:  MIRALAX / GLYCOLAX  Take 17 g by mouth 2 (two) times daily.     senna-docusate 8.6-50 MG per tablet  Commonly known as:  Senokot-S  Take 2 tablets by mouth 2 (two) times daily.     traMADol 50 MG tablet  Commonly known as:  ULTRAM  Take 1 tablet (50 mg total) by mouth every 6 (six) hours as needed (mild pain).     warfarin 5 MG tablet--1 1/2 tablet daily with supper  Commonly known as:  COUMADIN  Take Coumadin for four weeks and then discontinue.  The dose may need to be adjusted based upon the INR--Home Health to manage and dose coumadin--therapeutic range between 2.0 and 3.0 INR.  After four weeks of Coumadin, the patient may stop the Coumadin and take 81 mg Aspirin daily for four more weeks.           Follow-up Information   Call Erick Colace, MD. (As needed)    Specialty:  Physical Medicine and Rehabilitation   Contact information:   [address] [place] [address] [place] [address] [phone]       Follow up with Loanne Drilling, MD.   Specialty:  Orthopedic Surgery   Contact information:   [address] [place] [address] [phone]       Follow up with Pcp Not In System.      SignedJacquelynn Cree 02/09/2013, 9:22 AM

## 2013-02-09 NOTE — Progress Notes (Signed)
ANTICOAGULATION CONSULT NOTE - Follow Up Consult  Pharmacy Consult for coumadin Indication: VTE prophylaxis  Allergies  Allergen Reactions  . Penicillins     REACTION: swelling    Patient Measurements:   Heparin Dosing Weight:   Vital Signs: Temp: 99 F (37.2 C) (09/19 0614) Temp src: Oral (09/19 0614) BP: 133/68 mmHg (09/19 0614) Pulse Rate: 80 (09/19 0614)  Labs:  Recent Labs  02/07/13 0915 02/08/13 0530 02/09/13 0545  HGB  --  9.8*  --   HCT  --  28.8*  --   PLT  --  274  --   LABPROT 17.0* 17.6* 21.4*  INR 1.42 1.49 1.92*    The CrCl is unknown because both a height and weight (above a minimum accepted value) are required for this calculation.   Medications:  Scheduled:  . enoxaparin (LOVENOX) injection  30 mg Subcutaneous Q12H  . iron polysaccharides  150 mg Oral BID AC  . methocarbamol  500 mg Oral QID  . polyethylene glycol  17 g Oral BID  . senna-docusate  2 tablet Oral BID  . Warfarin - Pharmacist Dosing Inpatient   Does not apply q1800   Infusions:    Assessment: 62 yo male s/p TKA is currently on subtherapeutic coumadin.  INR has jumped to 1.92 from 1.49 after 2 consecutive days of coumadin 10mg .  Also on lovenox 30mg  sq q12h. Goal of Therapy:  INR 2-3 Monitor platelets by anticoagulation protocol: Yes   Plan:  1) Coumadin 7.5mg  po x1 2) Cont lovenox 30mg  sq q12h until INR >/= 2 3) INR in am  Farris Geiman, Tsz-Yin 02/09/2013,8:33 AM

## 2013-02-09 NOTE — Progress Notes (Signed)
Social Work Discharge Note Discharge Note  The overall goal for the admission was met for:   Discharge location: Yes-HOME WITH WIFE AND SON-WHO CAN PROVIDE 24 HR SUPERVISION  Length of Stay: Yes-4 DAYS  Discharge activity level: Yes-MOD/I LEVEL  Home/community participation: Yes  Services provided included: MD, RD, PT, OT, SLP, Pharmacy and SW  Financial Services: Private Insurance: BCBS  Follow-up services arranged: Home Health: [facility]-PT,OT,RN, DME: [facility] and Patient/Family request agency HH: [facility] , DME: [facility]  Comments (or additional information):PT MADE GOOD PROGRESS AND READY TO GO HOME  Patient/Family verbalized understanding of follow-up arrangements: Yes  Individual responsible for coordination of the follow-up plan: SELF & GAIL-WIFE  Confirmed correct DME delivered: Lucy Chris 02/09/2013    Lucy Chris

## 2013-02-09 NOTE — Progress Notes (Signed)
Social Work Patient ID: [name] [name], male   DOB: 01-Jul-1950, 62 y.o.   MRN: [id number] Checked with pt this am he has a rolling walker at home he can use.  Wife coming around 12;00 for discharge.

## 2013-02-09 NOTE — Progress Notes (Addendum)
Patient gave own lovenox injection with min instructions    Patient  Watched lovenox video . States comfortable with doing injection  No lovenox  Education  Kits not available per pharmacy

## 2015-07-15 ENCOUNTER — Encounter: Payer: Self-pay | Admitting: Gastroenterology

## 2018-10-27 DIAGNOSIS — Z23 Encounter for immunization: Secondary | ICD-10-CM | POA: Diagnosis not present

## 2018-10-27 DIAGNOSIS — R21 Rash and other nonspecific skin eruption: Secondary | ICD-10-CM | POA: Diagnosis not present

## 2018-10-27 DIAGNOSIS — M1380 Other specified arthritis, unspecified site: Secondary | ICD-10-CM | POA: Diagnosis not present

## 2018-10-27 DIAGNOSIS — W57XXXA Bitten or stung by nonvenomous insect and other nonvenomous arthropods, initial encounter: Secondary | ICD-10-CM | POA: Diagnosis not present

## 2018-11-02 DIAGNOSIS — Z1159 Encounter for screening for other viral diseases: Secondary | ICD-10-CM | POA: Diagnosis not present

## 2018-11-02 DIAGNOSIS — L03319 Cellulitis of trunk, unspecified: Secondary | ICD-10-CM | POA: Diagnosis not present

## 2018-11-02 DIAGNOSIS — W57XXXD Bitten or stung by nonvenomous insect and other nonvenomous arthropods, subsequent encounter: Secondary | ICD-10-CM | POA: Diagnosis not present

## 2018-11-02 DIAGNOSIS — M1380 Other specified arthritis, unspecified site: Secondary | ICD-10-CM | POA: Diagnosis not present

## 2018-11-02 DIAGNOSIS — R21 Rash and other nonspecific skin eruption: Secondary | ICD-10-CM | POA: Diagnosis not present

## 2018-11-09 DIAGNOSIS — W57XXXD Bitten or stung by nonvenomous insect and other nonvenomous arthropods, subsequent encounter: Secondary | ICD-10-CM | POA: Diagnosis not present

## 2018-11-09 DIAGNOSIS — M1380 Other specified arthritis, unspecified site: Secondary | ICD-10-CM | POA: Diagnosis not present

## 2018-11-09 DIAGNOSIS — R21 Rash and other nonspecific skin eruption: Secondary | ICD-10-CM | POA: Diagnosis not present

## 2018-11-09 DIAGNOSIS — L03319 Cellulitis of trunk, unspecified: Secondary | ICD-10-CM | POA: Diagnosis not present

## 2019-01-10 ENCOUNTER — Encounter: Payer: Self-pay | Admitting: Gastroenterology

## 2019-11-27 DIAGNOSIS — B356 Tinea cruris: Secondary | ICD-10-CM | POA: Diagnosis not present

## 2019-11-27 DIAGNOSIS — K600 Acute anal fissure: Secondary | ICD-10-CM | POA: Diagnosis not present

## 2019-11-27 DIAGNOSIS — Z1159 Encounter for screening for other viral diseases: Secondary | ICD-10-CM | POA: Diagnosis not present

## 2019-11-27 DIAGNOSIS — K625 Hemorrhage of anus and rectum: Secondary | ICD-10-CM | POA: Diagnosis not present

## 2019-11-30 DIAGNOSIS — K645 Perianal venous thrombosis: Secondary | ICD-10-CM | POA: Diagnosis not present

## 2019-12-05 ENCOUNTER — Encounter: Payer: Self-pay | Admitting: Gastroenterology

## 2019-12-06 DIAGNOSIS — Z125 Encounter for screening for malignant neoplasm of prostate: Secondary | ICD-10-CM | POA: Diagnosis not present

## 2019-12-06 DIAGNOSIS — E559 Vitamin D deficiency, unspecified: Secondary | ICD-10-CM | POA: Diagnosis not present

## 2019-12-06 DIAGNOSIS — B356 Tinea cruris: Secondary | ICD-10-CM | POA: Diagnosis not present

## 2019-12-06 DIAGNOSIS — E789 Disorder of lipoprotein metabolism, unspecified: Secondary | ICD-10-CM | POA: Diagnosis not present

## 2019-12-06 DIAGNOSIS — K625 Hemorrhage of anus and rectum: Secondary | ICD-10-CM | POA: Diagnosis not present

## 2019-12-06 DIAGNOSIS — R0602 Shortness of breath: Secondary | ICD-10-CM | POA: Diagnosis not present

## 2019-12-06 DIAGNOSIS — Z1339 Encounter for screening examination for other mental health and behavioral disorders: Secondary | ICD-10-CM | POA: Diagnosis not present

## 2019-12-06 DIAGNOSIS — R5383 Other fatigue: Secondary | ICD-10-CM | POA: Diagnosis not present

## 2019-12-06 DIAGNOSIS — Z0000 Encounter for general adult medical examination without abnormal findings: Secondary | ICD-10-CM | POA: Diagnosis not present

## 2019-12-06 DIAGNOSIS — Z131 Encounter for screening for diabetes mellitus: Secondary | ICD-10-CM | POA: Diagnosis not present

## 2019-12-06 DIAGNOSIS — Z1331 Encounter for screening for depression: Secondary | ICD-10-CM | POA: Diagnosis not present

## 2019-12-06 DIAGNOSIS — K645 Perianal venous thrombosis: Secondary | ICD-10-CM | POA: Diagnosis not present

## 2019-12-06 DIAGNOSIS — Z113 Encounter for screening for infections with a predominantly sexual mode of transmission: Secondary | ICD-10-CM | POA: Diagnosis not present

## 2019-12-21 DIAGNOSIS — R7303 Prediabetes: Secondary | ICD-10-CM | POA: Diagnosis not present

## 2019-12-21 DIAGNOSIS — E559 Vitamin D deficiency, unspecified: Secondary | ICD-10-CM | POA: Diagnosis not present

## 2019-12-21 DIAGNOSIS — R0602 Shortness of breath: Secondary | ICD-10-CM | POA: Diagnosis not present

## 2019-12-21 DIAGNOSIS — Z9229 Personal history of other drug therapy: Secondary | ICD-10-CM | POA: Diagnosis not present

## 2019-12-28 DIAGNOSIS — Z008 Encounter for other general examination: Secondary | ICD-10-CM | POA: Diagnosis not present

## 2019-12-28 DIAGNOSIS — R942 Abnormal results of pulmonary function studies: Secondary | ICD-10-CM | POA: Diagnosis not present

## 2019-12-31 DIAGNOSIS — K645 Perianal venous thrombosis: Secondary | ICD-10-CM | POA: Diagnosis not present

## 2020-01-22 ENCOUNTER — Other Ambulatory Visit: Payer: Self-pay

## 2020-01-22 ENCOUNTER — Ambulatory Visit (AMBULATORY_SURGERY_CENTER): Payer: Self-pay | Admitting: *Deleted

## 2020-01-22 ENCOUNTER — Encounter: Payer: Self-pay | Admitting: Gastroenterology

## 2020-01-22 VITALS — Ht 70.5 in | Wt 302.0 lb

## 2020-01-22 DIAGNOSIS — Z1211 Encounter for screening for malignant neoplasm of colon: Secondary | ICD-10-CM

## 2020-01-22 MED ORDER — NA SULFATE-K SULFATE-MG SULF 17.5-3.13-1.6 GM/177ML PO SOLN: 1.0000 | Freq: Once | ORAL | 0 refills | Status: AC

## 2020-01-22 NOTE — Progress Notes (Signed)
No egg or soy allergy known to patient  No issues with past sedation with any surgeries or procedures no intubation problems in the past  No FH of Malignant Hyperthermia No diet pills per patient No home 02 use per patient  No blood thinners per patient  Pt denies issues with constipation  No A fib or A flutter  EMMI video to pt or via MyChart  COVID 19 guidelines implemented in PV today with Pt and RN   Suprep Coupon given to pt in PV today , Code to Pharmacy   Due to the COVID-19 pandemic we are asking patients to follow these guidelines. Please only bring one care partner. Please be aware that your care partner may wait in the car in the parking lot or if they feel like they will be too hot to wait in the car, they may wait in the lobby on the 4th floor. All care partners are required to wear a mask the entire time (we do not have any that we can provide them), they need to practice social distancing, and we will do a Covid check for all patient's and care partners when you arrive. Also we will check their temperature and your temperature. If the care partner waits in their car they need to stay in the parking lot the entire time and we will call them on their cell phone when the patient is ready for discharge so they can bring the car to the front of the building. Also all patient's will need to wear a mask into building.  

## 2020-01-29 DIAGNOSIS — E213 Hyperparathyroidism, unspecified: Secondary | ICD-10-CM | POA: Diagnosis not present

## 2020-02-05 ENCOUNTER — Ambulatory Visit (AMBULATORY_SURGERY_CENTER): Payer: PPO | Admitting: Gastroenterology

## 2020-02-05 ENCOUNTER — Encounter: Payer: Self-pay | Admitting: Gastroenterology

## 2020-02-05 ENCOUNTER — Other Ambulatory Visit: Payer: Self-pay

## 2020-02-05 VITALS — BP 133/68 | HR 73 | Temp 98.6°F | Resp 11 | Ht 70.0 in | Wt 302.0 lb

## 2020-02-05 DIAGNOSIS — G2581 Restless legs syndrome: Secondary | ICD-10-CM | POA: Diagnosis not present

## 2020-02-05 DIAGNOSIS — D122 Benign neoplasm of ascending colon: Secondary | ICD-10-CM

## 2020-02-05 DIAGNOSIS — D125 Benign neoplasm of sigmoid colon: Secondary | ICD-10-CM | POA: Diagnosis not present

## 2020-02-05 DIAGNOSIS — Z1211 Encounter for screening for malignant neoplasm of colon: Secondary | ICD-10-CM

## 2020-02-05 DIAGNOSIS — K640 First degree hemorrhoids: Secondary | ICD-10-CM

## 2020-02-05 DIAGNOSIS — K5730 Diverticulosis of large intestine without perforation or abscess without bleeding: Secondary | ICD-10-CM

## 2020-02-05 MED ORDER — SODIUM CHLORIDE 0.9 % IV SOLN: 500.0000 mL | Freq: Once | INTRAVENOUS | Status: DC

## 2020-02-05 NOTE — Progress Notes (Signed)
Report to PACU, RN, vss, BBS= Clear.  

## 2020-02-05 NOTE — Patient Instructions (Signed)
Handouts given:  Diverticulosis, Polyps, Hemorrhoids Resume previous diet Continue present medications Await pathology results Repeat colonoscopy in 5-10 years Use fiber like:  Citrucel, fibercon, metamucil  YOU HAD AN ENDOSCOPIC PROCEDURE TODAY AT [facility]:   Refer to the procedure report that was given to you for any specific questions about what was found during the examination.  If the procedure report does not answer your questions, please call your gastroenterologist to clarify.  If you requested that your care partner not be given the details of your procedure findings, then the procedure report has been included in a sealed envelope for you to review at your convenience later.  YOU SHOULD EXPECT: Some feelings of bloating in the abdomen. Passage of more gas than usual.  Walking can help get rid of the air that was put into your GI tract during the procedure and reduce the bloating. If you had a lower endoscopy (such as a colonoscopy or flexible sigmoidoscopy) you may notice spotting of blood in your stool or on the toilet paper. If you underwent a bowel prep for your procedure, you may not have a normal bowel movement for a few days.  Please Note:  You might notice some irritation and congestion in your nose or some drainage.  This is from the oxygen used during your procedure.  There is no need for concern and it should clear up in a day or so.  SYMPTOMS TO REPORT IMMEDIATELY:   Following lower endoscopy (colonoscopy or flexible sigmoidoscopy):  Excessive amounts of blood in the stool  Significant tenderness or worsening of abdominal pains  Swelling of the abdomen that is new, acute  Fever of 100F or higher   For urgent or emergent issues, a gastroenterologist can be reached at any hour by calling [phone]. Do not use MyChart messaging for urgent concerns.    DIET:  We do recommend a small meal at first, but then you may proceed to your regular diet.   Drink plenty of fluids but you should avoid alcoholic beverages for 24 hours.  ACTIVITY:  You should plan to take it easy for the rest of today and you should NOT DRIVE or use heavy machinery until tomorrow (because of the sedation medicines used during the test).    FOLLOW UP: Our staff will call the number listed on your records 48-72 hours following your procedure to check on you and address any questions or concerns that you may have regarding the information given to you following your procedure. If we do not reach you, we will leave a message.  We will attempt to reach you two times.  During this call, we will ask if you have developed any symptoms of COVID 19. If you develop any symptoms (ie: fever, flu-like symptoms, shortness of breath, cough etc.) before then, please call [phone].  If you test positive for Covid 19 in the 2 weeks post procedure, please call and report this information to [place].    If any biopsies were taken you will be contacted by phone or by letter within the next 1-3 weeks.  Please call us at [phone] if you have not heard about the biopsies in 3 weeks.    SIGNATURES/CONFIDENTIALITY: You and/or your care partner have signed paperwork which will be entered into your electronic medical record.  These signatures attest to the fact that that the information above on your After Visit Summary has been reviewed and is understood.  Full responsibility of the confidentiality  of this discharge information lies with you and/or your care-partner.

## 2020-02-05 NOTE — Progress Notes (Signed)
Pt's states no medical or surgical changes since previsit or office visit. 

## 2020-02-05 NOTE — Op Note (Signed)
[name] [name] Patient Name: [name] [name] Procedure Date: 02/05/2020 2:49 PM MRN: [id number] Endoscopist: Gerrit Heck , MD Age: 69 Referring MD:  Date of Birth: 1951-04-28 Gender: Male Account #: [id number] Procedure:                Colonoscopy Indications:              Screening for colorectal malignant neoplasm (last                            colonoscopy was >10 years ago)                           Last colonoscopy was in 12/2008 and notable for                            diverticulosis with recommendation to repeat in 10                            years. Medicines:                Monitored Anesthesia Care Procedure:                Pre-Anesthesia Assessment:                           - Prior to the procedure, a History and Physical                            was performed, and patient medications and                            allergies were reviewed. The patient's tolerance of                            previous anesthesia was also reviewed. The risks                            and benefits of the procedure and the sedation                            options and risks were discussed with the patient.                            All questions were answered, and informed consent                            was obtained. Prior Anticoagulants: The patient has                            taken no previous anticoagulant or antiplatelet                            agents. ASA Grade Assessment: III - A patient with  severe systemic disease. After reviewing the risks                            and benefits, the patient was deemed in                            satisfactory condition to undergo the procedure.                           After obtaining informed consent, the colonoscope                            was passed under direct vision. Throughout the                            procedure, the patient's blood pressure, pulse, and                             oxygen saturations were monitored continuously. The                            Colonoscope was introduced through the anus and                            advanced to the the terminal ileum. The colonoscopy                            was performed without difficulty. The patient                            tolerated the procedure well. The quality of the                            bowel preparation was good. The terminal ileum,                            ileocecal valve, appendiceal orifice, and rectum                            were photographed. Scope In: 2:55:01 PM Scope Out: 3:14:07 PM Scope Withdrawal Time: 0 hours 15 minutes 6 seconds  Total Procedure Duration: 0 hours 19 minutes 6 seconds  Findings:                 The perianal and digital rectal examinations were                            normal.                           Two sessile polyps were found in the sigmoid colon                            and ascending colon. The polyps were 4 to 5 mm in  size. These polyps were removed with a cold snare.                            Resection and retrieval were complete. Estimated                            blood loss was minimal.                           Multiple small and large-mouthed diverticula were                            found in the sigmoid colon.                           Non-bleeding internal hemorrhoids were found during                            retroflexion. The hemorrhoids were small and Grade                            I (internal hemorrhoids that do not prolapse).                           The terminal ileum appeared normal. Complications:            No immediate complications. Estimated Blood Loss:     Estimated blood loss was minimal. Impression:               - Two 4 to 5 mm polyps in the sigmoid colon and in                            the ascending colon, removed with a cold snare.                            Resected and retrieved.                            - Diverticulosis in the sigmoid colon.                           - Non-bleeding internal hemorrhoids.                           - The examined portion of the ileum was normal. Recommendation:           - Patient has a contact number available for                            emergencies. The signs and symptoms of potential                            delayed complications were discussed with the                            patient.  Return to normal activities tomorrow.                            Written discharge instructions were provided to the                            patient.                           - Resume previous diet.                           - Continue present medications.                           - Await pathology results.                           - Repeat colonoscopy in 5-10 years for surveillance                            based on pathology results.                           - Return to GI office PRN.                           - Use fiber, for example Citrucel, Fibercon, Konsyl                            or Metamucil. Gerrit Heck, MD 02/05/2020 3:18:57 PM

## 2020-02-07 ENCOUNTER — Telehealth: Payer: Self-pay

## 2020-02-07 NOTE — Telephone Encounter (Signed)
  Follow up Call-  Call back number 02/05/2020  Post procedure Call Back phone  # [phone]  Permission to leave phone message Yes  Some recent data might be hidden     Patient questions:  Do you have a fever, pain , or abdominal swelling? No. Pain Score  0 *  Have you tolerated food without any problems? Yes.    Have you been able to return to your normal activities? Yes.    Do you have any questions about your discharge instructions: Diet   No. Medications  No. Follow up visit  No.  Do you have questions or concerns about your Care? No.  Actions: * If pain score is 4 or above: No action needed, pain <4.   1. Have you developed a fever since your procedure? No    2.   Have you had an respiratory symptoms (SOB or cough) since your procedure? No    3.   Have you tested positive for COVID 19 since your procedure? No   4.   Have you had any family members/close contacts diagnosed with the COVID 19 since your procedure?  No    If yes to any of these questions please route to Joylene John, RN and Joella Prince, RN

## 2020-02-11 ENCOUNTER — Encounter: Payer: Self-pay | Admitting: Gastroenterology

## 2020-03-31 DIAGNOSIS — E559 Vitamin D deficiency, unspecified: Secondary | ICD-10-CM | POA: Diagnosis not present

## 2020-03-31 DIAGNOSIS — R7303 Prediabetes: Secondary | ICD-10-CM | POA: Diagnosis not present

## 2020-03-31 DIAGNOSIS — Z1159 Encounter for screening for other viral diseases: Secondary | ICD-10-CM | POA: Diagnosis not present

## 2020-03-31 DIAGNOSIS — E213 Hyperparathyroidism, unspecified: Secondary | ICD-10-CM | POA: Diagnosis not present

## 2020-03-31 DIAGNOSIS — Z9229 Personal history of other drug therapy: Secondary | ICD-10-CM | POA: Diagnosis not present

## 2020-06-02 DIAGNOSIS — G4710 Hypersomnia, unspecified: Secondary | ICD-10-CM | POA: Diagnosis not present

## 2020-06-02 DIAGNOSIS — G2581 Restless legs syndrome: Secondary | ICD-10-CM | POA: Diagnosis not present

## 2020-06-02 DIAGNOSIS — Z6841 Body Mass Index (BMI) 40.0 and over, adult: Secondary | ICD-10-CM | POA: Diagnosis not present

## 2020-06-02 DIAGNOSIS — R7303 Prediabetes: Secondary | ICD-10-CM | POA: Diagnosis not present

## 2020-07-07 DIAGNOSIS — Z9229 Personal history of other drug therapy: Secondary | ICD-10-CM | POA: Diagnosis not present

## 2020-07-07 DIAGNOSIS — Z1159 Encounter for screening for other viral diseases: Secondary | ICD-10-CM | POA: Diagnosis not present

## 2020-07-07 DIAGNOSIS — R030 Elevated blood-pressure reading, without diagnosis of hypertension: Secondary | ICD-10-CM | POA: Diagnosis not present

## 2020-07-07 DIAGNOSIS — R7303 Prediabetes: Secondary | ICD-10-CM | POA: Diagnosis not present

## 2020-07-07 DIAGNOSIS — E559 Vitamin D deficiency, unspecified: Secondary | ICD-10-CM | POA: Diagnosis not present

## 2020-08-15 DIAGNOSIS — R0902 Hypoxemia: Secondary | ICD-10-CM | POA: Diagnosis not present

## 2020-08-15 DIAGNOSIS — G4736 Sleep related hypoventilation in conditions classified elsewhere: Secondary | ICD-10-CM | POA: Diagnosis not present

## 2020-08-15 DIAGNOSIS — G4733 Obstructive sleep apnea (adult) (pediatric): Secondary | ICD-10-CM | POA: Diagnosis not present

## 2020-08-27 DIAGNOSIS — G4733 Obstructive sleep apnea (adult) (pediatric): Secondary | ICD-10-CM | POA: Diagnosis not present

## 2020-08-27 DIAGNOSIS — G2581 Restless legs syndrome: Secondary | ICD-10-CM | POA: Diagnosis not present

## 2020-08-27 DIAGNOSIS — Z6841 Body Mass Index (BMI) 40.0 and over, adult: Secondary | ICD-10-CM | POA: Diagnosis not present

## 2020-09-08 DIAGNOSIS — G4730 Sleep apnea, unspecified: Secondary | ICD-10-CM | POA: Diagnosis not present

## 2020-09-08 DIAGNOSIS — Z1339 Encounter for screening examination for other mental health and behavioral disorders: Secondary | ICD-10-CM | POA: Diagnosis not present

## 2020-09-08 DIAGNOSIS — R5383 Other fatigue: Secondary | ICD-10-CM | POA: Diagnosis not present

## 2020-09-08 DIAGNOSIS — Z113 Encounter for screening for infections with a predominantly sexual mode of transmission: Secondary | ICD-10-CM | POA: Diagnosis not present

## 2020-09-08 DIAGNOSIS — R0602 Shortness of breath: Secondary | ICD-10-CM | POA: Diagnosis not present

## 2020-09-08 DIAGNOSIS — E213 Hyperparathyroidism, unspecified: Secondary | ICD-10-CM | POA: Diagnosis not present

## 2020-09-08 DIAGNOSIS — R030 Elevated blood-pressure reading, without diagnosis of hypertension: Secondary | ICD-10-CM | POA: Diagnosis not present

## 2020-09-08 DIAGNOSIS — Z1159 Encounter for screening for other viral diseases: Secondary | ICD-10-CM | POA: Diagnosis not present

## 2020-09-08 DIAGNOSIS — E559 Vitamin D deficiency, unspecified: Secondary | ICD-10-CM | POA: Diagnosis not present

## 2020-09-08 DIAGNOSIS — Z0000 Encounter for general adult medical examination without abnormal findings: Secondary | ICD-10-CM | POA: Diagnosis not present

## 2020-09-08 DIAGNOSIS — Z1331 Encounter for screening for depression: Secondary | ICD-10-CM | POA: Diagnosis not present

## 2020-09-08 DIAGNOSIS — R7303 Prediabetes: Secondary | ICD-10-CM | POA: Diagnosis not present

## 2020-09-12 DIAGNOSIS — G4761 Periodic limb movement disorder: Secondary | ICD-10-CM | POA: Diagnosis not present

## 2020-09-12 DIAGNOSIS — G4736 Sleep related hypoventilation in conditions classified elsewhere: Secondary | ICD-10-CM | POA: Diagnosis not present

## 2020-09-12 DIAGNOSIS — G4733 Obstructive sleep apnea (adult) (pediatric): Secondary | ICD-10-CM | POA: Diagnosis not present

## 2020-09-12 DIAGNOSIS — R0902 Hypoxemia: Secondary | ICD-10-CM | POA: Diagnosis not present

## 2020-09-15 DIAGNOSIS — R0602 Shortness of breath: Secondary | ICD-10-CM | POA: Diagnosis not present

## 2020-09-15 DIAGNOSIS — G4730 Sleep apnea, unspecified: Secondary | ICD-10-CM | POA: Diagnosis not present

## 2020-09-15 DIAGNOSIS — R030 Elevated blood-pressure reading, without diagnosis of hypertension: Secondary | ICD-10-CM | POA: Diagnosis not present

## 2020-09-15 DIAGNOSIS — R7303 Prediabetes: Secondary | ICD-10-CM | POA: Diagnosis not present

## 2020-09-22 DIAGNOSIS — E669 Obesity, unspecified: Secondary | ICD-10-CM | POA: Diagnosis not present

## 2020-09-22 DIAGNOSIS — E559 Vitamin D deficiency, unspecified: Secondary | ICD-10-CM | POA: Diagnosis not present

## 2020-09-22 DIAGNOSIS — R942 Abnormal results of pulmonary function studies: Secondary | ICD-10-CM | POA: Diagnosis not present

## 2020-09-22 DIAGNOSIS — R7303 Prediabetes: Secondary | ICD-10-CM | POA: Diagnosis not present

## 2020-09-22 DIAGNOSIS — G4733 Obstructive sleep apnea (adult) (pediatric): Secondary | ICD-10-CM | POA: Diagnosis not present

## 2020-09-26 DIAGNOSIS — R7303 Prediabetes: Secondary | ICD-10-CM | POA: Diagnosis not present

## 2020-09-26 DIAGNOSIS — G4733 Obstructive sleep apnea (adult) (pediatric): Secondary | ICD-10-CM | POA: Diagnosis not present

## 2020-09-26 DIAGNOSIS — Z6841 Body Mass Index (BMI) 40.0 and over, adult: Secondary | ICD-10-CM | POA: Diagnosis not present

## 2020-09-26 DIAGNOSIS — G2581 Restless legs syndrome: Secondary | ICD-10-CM | POA: Diagnosis not present

## 2020-12-22 DIAGNOSIS — Z6841 Body Mass Index (BMI) 40.0 and over, adult: Secondary | ICD-10-CM | POA: Diagnosis not present

## 2020-12-22 DIAGNOSIS — R030 Elevated blood-pressure reading, without diagnosis of hypertension: Secondary | ICD-10-CM | POA: Diagnosis not present

## 2020-12-22 DIAGNOSIS — E213 Hyperparathyroidism, unspecified: Secondary | ICD-10-CM | POA: Diagnosis not present

## 2020-12-22 DIAGNOSIS — Z1159 Encounter for screening for other viral diseases: Secondary | ICD-10-CM | POA: Diagnosis not present

## 2020-12-22 DIAGNOSIS — E559 Vitamin D deficiency, unspecified: Secondary | ICD-10-CM | POA: Diagnosis not present

## 2020-12-22 DIAGNOSIS — R7303 Prediabetes: Secondary | ICD-10-CM | POA: Diagnosis not present

## 2020-12-31 DIAGNOSIS — Z6841 Body Mass Index (BMI) 40.0 and over, adult: Secondary | ICD-10-CM | POA: Diagnosis not present

## 2020-12-31 DIAGNOSIS — R7303 Prediabetes: Secondary | ICD-10-CM | POA: Diagnosis not present

## 2020-12-31 DIAGNOSIS — E559 Vitamin D deficiency, unspecified: Secondary | ICD-10-CM | POA: Diagnosis not present

## 2020-12-31 DIAGNOSIS — G4733 Obstructive sleep apnea (adult) (pediatric): Secondary | ICD-10-CM | POA: Diagnosis not present

## 2021-01-05 DIAGNOSIS — G4733 Obstructive sleep apnea (adult) (pediatric): Secondary | ICD-10-CM | POA: Diagnosis not present

## 2021-02-05 DIAGNOSIS — G4733 Obstructive sleep apnea (adult) (pediatric): Secondary | ICD-10-CM | POA: Diagnosis not present

## 2021-03-02 DIAGNOSIS — R7303 Prediabetes: Secondary | ICD-10-CM | POA: Diagnosis not present

## 2021-03-02 DIAGNOSIS — Z6841 Body Mass Index (BMI) 40.0 and over, adult: Secondary | ICD-10-CM | POA: Diagnosis not present

## 2021-03-02 DIAGNOSIS — G2581 Restless legs syndrome: Secondary | ICD-10-CM | POA: Diagnosis not present

## 2021-03-02 DIAGNOSIS — G4733 Obstructive sleep apnea (adult) (pediatric): Secondary | ICD-10-CM | POA: Diagnosis not present

## 2021-03-07 DIAGNOSIS — G4733 Obstructive sleep apnea (adult) (pediatric): Secondary | ICD-10-CM | POA: Diagnosis not present

## 2021-03-30 DIAGNOSIS — R030 Elevated blood-pressure reading, without diagnosis of hypertension: Secondary | ICD-10-CM | POA: Diagnosis not present

## 2021-03-30 DIAGNOSIS — Z1159 Encounter for screening for other viral diseases: Secondary | ICD-10-CM | POA: Diagnosis not present

## 2021-03-30 DIAGNOSIS — Z6841 Body Mass Index (BMI) 40.0 and over, adult: Secondary | ICD-10-CM | POA: Diagnosis not present

## 2021-03-30 DIAGNOSIS — E559 Vitamin D deficiency, unspecified: Secondary | ICD-10-CM | POA: Diagnosis not present

## 2021-03-30 DIAGNOSIS — R7303 Prediabetes: Secondary | ICD-10-CM | POA: Diagnosis not present

## 2021-03-30 DIAGNOSIS — E213 Hyperparathyroidism, unspecified: Secondary | ICD-10-CM | POA: Diagnosis not present

## 2021-04-07 DIAGNOSIS — G4733 Obstructive sleep apnea (adult) (pediatric): Secondary | ICD-10-CM | POA: Diagnosis not present

## 2021-04-08 ENCOUNTER — Emergency Department (HOSPITAL_BASED_OUTPATIENT_CLINIC_OR_DEPARTMENT_OTHER): Payer: PPO

## 2021-04-08 ENCOUNTER — Encounter (HOSPITAL_COMMUNITY): Payer: Self-pay | Admitting: *Deleted

## 2021-04-08 ENCOUNTER — Other Ambulatory Visit: Payer: Self-pay

## 2021-04-08 ENCOUNTER — Emergency Department (HOSPITAL_COMMUNITY)
Admission: EM | Admit: 2021-04-08 | Discharge: 2021-04-08 | Disposition: A | Discharge status: Home / Self Care | Payer: PPO | Attending: Emergency Medicine | Admitting: Emergency Medicine

## 2021-04-08 DIAGNOSIS — Z6841 Body Mass Index (BMI) 40.0 and over, adult: Secondary | ICD-10-CM | POA: Diagnosis not present

## 2021-04-08 DIAGNOSIS — R112 Nausea with vomiting, unspecified: Secondary | ICD-10-CM | POA: Diagnosis not present

## 2021-04-08 DIAGNOSIS — Z96653 Presence of artificial knee joint, bilateral: Secondary | ICD-10-CM | POA: Diagnosis not present

## 2021-04-08 DIAGNOSIS — L03116 Cellulitis of left lower limb: Secondary | ICD-10-CM

## 2021-04-08 DIAGNOSIS — L538 Other specified erythematous conditions: Secondary | ICD-10-CM | POA: Diagnosis not present

## 2021-04-08 DIAGNOSIS — L539 Erythematous condition, unspecified: Secondary | ICD-10-CM | POA: Insufficient documentation

## 2021-04-08 DIAGNOSIS — M79605 Pain in left leg: Secondary | ICD-10-CM | POA: Diagnosis not present

## 2021-04-08 DIAGNOSIS — M79662 Pain in left lower leg: Secondary | ICD-10-CM | POA: Insufficient documentation

## 2021-04-08 DIAGNOSIS — M7989 Other specified soft tissue disorders: Secondary | ICD-10-CM

## 2021-04-08 LAB — COMPREHENSIVE METABOLIC PANEL
ALT: 20 U/L (ref 0–44)
AST: 26 U/L (ref 15–41)
Albumin: 3.2 g/dL — ABNORMAL LOW (ref 3.5–5.0)
Alkaline Phosphatase: 61 U/L (ref 38–126)
Anion gap: 10 (ref 5–15)
BUN: 25 mg/dL — ABNORMAL HIGH (ref 8–23)
CO2: 23 mmol/L (ref 22–32)
Calcium: 8.8 mg/dL — ABNORMAL LOW (ref 8.9–10.3)
Chloride: 102 mmol/L (ref 98–111)
Creatinine, Ser: 1.34 mg/dL — ABNORMAL HIGH (ref 0.61–1.24)
GFR, Estimated: 57 mL/min — ABNORMAL LOW (ref >60)
Glucose, Bld: 124 mg/dL — ABNORMAL HIGH (ref 70–99)
Potassium: 4.1 mmol/L (ref 3.5–5.1)
Sodium: 135 mmol/L (ref 135–145)
Total Bilirubin: 1.1 mg/dL (ref 0.3–1.2)
Total Protein: 6.7 g/dL (ref 6.5–8.1)

## 2021-04-08 LAB — CBC WITH DIFFERENTIAL/PLATELET
Abs Immature Granulocytes: 0.07 10*3/uL (ref 0.00–0.07)
Basophils Absolute: 0 10*3/uL (ref 0.0–0.1)
Basophils Relative: 0 %
Eosinophils Absolute: 0 10*3/uL (ref 0.0–0.5)
Eosinophils Relative: 0 %
HCT: 45 % (ref 39.0–52.0)
Hemoglobin: 14.6 g/dL (ref 13.0–17.0)
Immature Granulocytes: 1 %
Lymphocytes Relative: 12 %
Lymphs Abs: 1.7 10*3/uL (ref 0.7–4.0)
MCH: 29.9 pg (ref 26.0–34.0)
MCHC: 32.4 g/dL (ref 30.0–36.0)
MCV: 92.2 fL (ref 80.0–100.0)
Monocytes Absolute: 1.2 10*3/uL — ABNORMAL HIGH (ref 0.1–1.0)
Monocytes Relative: 9 %
Neutro Abs: 10.9 10*3/uL — ABNORMAL HIGH (ref 1.7–7.7)
Neutrophils Relative %: 78 %
Platelets: 164 10*3/uL (ref 150–400)
RBC: 4.88 MIL/uL (ref 4.22–5.81)
RDW: 15.5 % (ref 11.5–15.5)
WBC: 13.9 10*3/uL — ABNORMAL HIGH (ref 4.0–10.5)
nRBC: 0 % (ref 0.0–0.2)

## 2021-04-08 LAB — LIPASE, BLOOD: Lipase: 29 U/L (ref 11–51)

## 2021-04-08 MED ORDER — SODIUM CHLORIDE 0.9 % IV BOLUS
1000.0000 mL | Freq: Once | INTRAVENOUS | Status: AC
  Administered 2021-04-08: 1000 mL via INTRAVENOUS

## 2021-04-08 MED ORDER — DOXYCYCLINE HYCLATE 100 MG PO CAPS: 100.0000 mg | ORAL_CAPSULE | Freq: Two times a day (BID) | ORAL | 0 refills | Status: AC

## 2021-04-08 NOTE — Progress Notes (Signed)
Lower extremity venous LT study completed.  Preliminary results relayed to [place], [place] via secure chat.  See CV Proc for preliminary results report.   Darlin Coco, RDMS, RVT

## 2021-04-08 NOTE — ED Provider Notes (Signed)
Emergency Medicine Provider Triage Evaluation Note  [name] [name] , a 70 y.o. male  was evaluated in triage.  Pt complains of left lower extremity edema and erythema that started yesterday.  Patient evaluated by PCP just prior to arrival and sent to the ED to rule out DVT.  Denies chest pain and shortness of breath.  No history of blood clots.  He is not currently on any blood thinners.  Patient also endorses 3 episodes of nonbloody, nonbilious emesis yesterday.  Denies associated abdominal pain.  No fever or chills.  No known injury to left lower extremity.  Review of Systems  Positive: edema Negative: CP, SOB  Physical Exam  BP 132/80 (BP Location: Right Arm)   Pulse 84   Temp 98 F (36.7 C)   Resp 17   SpO2 94%  Gen:   Awake, no distress   Resp:  Normal effort  MSK:   Moves extremities without difficulty  Other:  Erythema to LLE with some edema  Medical Decision Making  Medically screening exam initiated at 11:14 AM.  Appropriate orders placed.  [name] [name] was informed that the remainder of the evaluation will be completed by another provider, this initial triage assessment does not replace that evaluation, and the importance of remaining in the ED until their evaluation is complete.  [place] to rule out DVT Labs due to N/V   [name] Bouchard, PA-C 04/08/21 1116    Horton, Alvin Critchley, DO 04/08/21 1408

## 2021-04-08 NOTE — ED Provider Notes (Signed)
[facility] EMERGENCY DEPARTMENT Provider Note   CSN: [id number] Arrival date & time: 04/08/21  1035     History Chief Complaint  Patient presents with   Leg Pain    [name] [name] is a 70 y.o. male.   Leg Pain Associated symptoms: no back pain and no fever    Patient presents with pain to the left lower extremity.  The started acutely yesterday, its been constant.  It is associated with edema primarily around the ankle as well as erythematous skin coloration changes.  Denies any cold temperature to the skin, denies any history of the same.  No chest pain or shortness of breath.  Patient had bilateral knee replacements in 1660, no complications since then.  He was on blood thinners after the operation but has not been on them since 2014.  No prior history of DVTs or blood clots.  Denies any fevers at home.  Patient reports that yesterday he vomited 3 times secondary to pain.  Denies any abdominal pain, the vomit was nonbilious and nonbloody.  He has not had any episodes since then, does report decreased oral intake in the last day or so due to pain.  States he has not eaten or drinking anything in almost 2 days.  Reports he was seen by his primary care doctor in the office and sent to the ED for emergent work-up to rule out blood clot.  Past Medical History:  Diagnosis Date   Arthritis    Cataract    Restless leg syndrome     Patient Active Problem List   Diagnosis Date Noted   Leucocytosis 02/05/2013   Postoperative anemia due to acute blood loss 02/01/2013   Hyponatremia 02/01/2013   Postop Hypotension 02/01/2013   OA (osteoarthritis) of bilateral knees 01/31/2013    Past Surgical History:  Procedure Laterality Date   cataract srugery      bilateral    CHOLECYSTECTOMY  2009   left leg surgery      right leg surgery      torn ligament and cartilage in 1972    torn retina      [place] Bilateral 01/31/2013   Procedure: TOTAL  KNEE BILATERAL;  Surgeon: Gearlean Alf, MD;  Location: [facility];  Service: Orthopedics;  Laterality: Bilateral;       Family History  Problem Relation Age of Onset   Stroke Father    Colon cancer Neg Hx    Stomach cancer Neg Hx    Rectal cancer Neg Hx    Esophageal cancer Neg Hx    Colon polyps Neg Hx     Social History   Tobacco Use   Smoking status: Never   Smokeless tobacco: Never  Substance Use Topics   Alcohol use: Yes    Comment: rarely   Drug use: No    Home Medications Prior to Admission medications   Medication Sig Start Date End Date Taking? Authorizing Provider  bisacodyl (DULCOLAX) 10 MG suppository Place 1 suppository (10 mg total) rectally daily as needed. 02/05/13   Perkins, Alexzandrew L, PA-C  cholecalciferol (VITAMIN D3) 25 MCG (1000 UNIT) tablet Take 1,000 Units by mouth daily.    [provider]  enoxaparin (LOVENOX) 30 MG/0.3ML injection Inject 0.3 mLs (30 mg total) into the skin every 12 (twelve) hours. Continue Lovenox injections until the INR is therapeutic at or greater than 2.0.  When INR reaches the therapeutic level of equal to or  greater than 2.0, the patient may discontinue the Lovenox injections. 02/09/13   Love, Ivan Anchors, PA-C  iron polysaccharides (NIFEREX) 150 MG capsule Take 1 capsule (150 mg total) by mouth 2 (two) times daily before lunch and supper. For anemia 02/09/13   Love, Ivan Anchors, PA-C  methocarbamol (ROBAXIN) 500 MG tablet Take 1 tablet (500 mg total) by mouth every 6 (six) hours as needed. 02/09/13   Love, Ivan Anchors, PA-C  polyethylene glycol (MIRALAX / GLYCOLAX) packet Take 17 g by mouth 2 (two) times daily. 02/09/13   Love, Ivan Anchors, PA-C  senna-docusate (SENOKOT-S) 8.6-50 MG per tablet Take 2 tablets by mouth 2 (two) times daily. 02/09/13   Love, Ivan Anchors, PA-C  traMADol (ULTRAM) 50 MG tablet Take 1 tablet (50 mg total) by mouth every 6 (six) hours as needed (mild pain). 02/09/13   Love, Ivan Anchors, PA-C  warfarin (COUMADIN) 5 MG  tablet Take Coumadin for four weeks and then discontinue.  The dose may need to be adjusted based upon the INR--Home Health to manage and dose coumadin--therapeutic range between 2.0 and 3.0 INR.  After four weeks of Coumadin, the patient may stop the Coumadin and take 81 mg Aspirin daily for four more weeks. Patient not taking: Reported on 02/05/2020 02/09/13   Bary Leriche, PA-C    Allergies    Penicillins  Review of Systems   Review of Systems  Constitutional:  Negative for fever.  Respiratory:  Negative for shortness of breath.   Cardiovascular:  Positive for leg swelling. Negative for chest pain.  Gastrointestinal:  Positive for nausea and vomiting. Negative for abdominal pain.  Musculoskeletal:  Negative for back pain.  Skin:  Positive for color change.   Physical Exam Updated Vital Signs BP 132/80 (BP Location: Right Arm)   Pulse 84   Temp 98 F (36.7 C)   Resp 17   SpO2 94%   Physical Exam Vitals and nursing note reviewed.  Constitutional:      Appearance: He is well-developed.  HENT:     Head: Normocephalic and atraumatic.  Eyes:     Conjunctiva/sclera: Conjunctivae normal.  Cardiovascular:     Rate and Rhythm: Normal rate and regular rhythm.     Heart sounds: No murmur heard.    Comments: DP and PT are 2+ Pulmonary:     Effort: Pulmonary effort is normal. No respiratory distress.     Breath sounds: Normal breath sounds.  Abdominal:     Palpations: Abdomen is soft.     Tenderness: There is no abdominal tenderness.  Musculoskeletal:        General: Swelling and tenderness present.     Cervical back: Neck supple.  Skin:    General: Skin is warm and dry.     Capillary Refill: Capillary refill takes less than 2 seconds.     Findings: Erythema present.     Comments: Picture below, warm to touch  Neurological:     Mental Status: He is alert.    ED Results / Procedures / Treatments   Labs (all labs ordered are listed, but only abnormal results are  displayed) Labs Reviewed  CBC WITH DIFFERENTIAL/PLATELET - Abnormal; Notable for the following components:      Result Value   WBC 13.9 (*)    Neutro Abs 10.9 (*)    Monocytes Absolute 1.2 (*)    All other components within normal limits  COMPREHENSIVE METABOLIC PANEL - Abnormal; Notable for the following components:   Glucose, Bld  124 (*)    BUN 25 (*)    Creatinine, Ser 1.34 (*)    Calcium 8.8 (*)    Albumin 3.2 (*)    GFR, Estimated 57 (*)    All other components within normal limits  LIPASE, BLOOD    EKG None  Radiology No results found.  Procedures Procedures   Medications Ordered in ED Medications - No data to display  ED Course  I have reviewed the triage vital signs and the nursing notes.  Pertinent labs & imaging results that were available during my care of the patient were reviewed by me and considered in my medical decision making (see chart for details).    MDM Rules/Calculators/A&P                           Stable vitals, patient is not febrile or tachycardic, no signs of sepsis.  He does have erythema to the lower extremity with some edema surrounding the ankle.  No tenderness to palpation of the ankle, no trauma or injury.  Unclear if x-ray would be beneficial.  Does report pain along the posterior calf soon ultrasound order set ordered already in triage.  Lab work ordered secondary to nausea and vomiting showing a slight AKI.  We will give patient a liter of fluid.  There is a slight leukocytosis which I suspect is secondary to due to the episode of emesis.  DVT study is negative, suspect this could be a cellulitis.  We will start the patient on antibiotics.  Patient reports his primary care doctor is already prescribed the antibiotics plan unable to see the note.  We discussed this together and agreed that I will print a paper copy of the antibiotic that he can fill if he does not already have one from his primary care doctor.  Patient discharged in  stable condition.  Return precautions discussed.  Final Clinical Impression(s) / ED Diagnoses Final diagnoses:  None    Rx / DC Orders ED Discharge Orders     None        Sherrill Raring, Hershal Coria 04/08/21 Killen, Ankit, MD 04/11/21 [id number]

## 2021-04-08 NOTE — ED Triage Notes (Signed)
Pt sent here due to left leg pain and swelling that started yesterday. Redness noted to leg. Denies injury, did have episode of n/v yesterday but denies fever.

## 2021-04-08 NOTE — Discharge Instructions (Signed)
Take doxycycline twice daily for the next 10 days.This is an antibiotic, you only need to take this if your primary care doctor is not already prescribed you an antibiotic.  Please do not take this antibiotic with another unless your primary care doctor okays it.  Your work-up today shows evidence of cellulitis which is a skin infection.  Your ultrasound was negative for blood clots.  If the rash does not improve or if it worsens in the next 48 hours so should be reasons to return back to the ED if PCP is unable to see you in the office.

## 2021-04-14 DIAGNOSIS — L03119 Cellulitis of unspecified part of limb: Secondary | ICD-10-CM | POA: Diagnosis not present

## 2021-04-14 DIAGNOSIS — R112 Nausea with vomiting, unspecified: Secondary | ICD-10-CM | POA: Diagnosis not present

## 2021-04-14 DIAGNOSIS — M79605 Pain in left leg: Secondary | ICD-10-CM | POA: Diagnosis not present

## 2021-04-14 DIAGNOSIS — R030 Elevated blood-pressure reading, without diagnosis of hypertension: Secondary | ICD-10-CM | POA: Diagnosis not present

## 2021-04-14 DIAGNOSIS — Z6841 Body Mass Index (BMI) 40.0 and over, adult: Secondary | ICD-10-CM | POA: Diagnosis not present

## 2021-04-14 DIAGNOSIS — B351 Tinea unguium: Secondary | ICD-10-CM | POA: Diagnosis not present

## 2021-04-29 DIAGNOSIS — Z6841 Body Mass Index (BMI) 40.0 and over, adult: Secondary | ICD-10-CM | POA: Diagnosis not present

## 2021-04-29 DIAGNOSIS — B351 Tinea unguium: Secondary | ICD-10-CM | POA: Diagnosis not present

## 2021-04-29 DIAGNOSIS — L03119 Cellulitis of unspecified part of limb: Secondary | ICD-10-CM | POA: Diagnosis not present

## 2021-04-29 DIAGNOSIS — M79605 Pain in left leg: Secondary | ICD-10-CM | POA: Diagnosis not present
